# Patient Record
Sex: Female | Born: 1980
Health system: Southern US, Community
[De-identification: ages and names within clinical notes are randomized; demographics above are authoritative.]

## PROBLEM LIST (undated history)

## (undated) ENCOUNTER — Inpatient Hospital Stay (HOSPITAL_COMMUNITY): Payer: Self-pay

## (undated) DIAGNOSIS — E119 Type 2 diabetes mellitus without complications: Secondary | ICD-10-CM

## (undated) DIAGNOSIS — D649 Anemia, unspecified: Secondary | ICD-10-CM

## (undated) DIAGNOSIS — O24419 Gestational diabetes mellitus in pregnancy, unspecified control: Secondary | ICD-10-CM

## (undated) DIAGNOSIS — F32A Depression, unspecified: Secondary | ICD-10-CM

## (undated) DIAGNOSIS — F329 Major depressive disorder, single episode, unspecified: Secondary | ICD-10-CM

## (undated) HISTORY — DX: Anemia, unspecified: D64.9

## (undated) HISTORY — DX: Type 2 diabetes mellitus without complications: E11.9

## (undated) HISTORY — DX: Gestational diabetes mellitus in pregnancy, unspecified control: O24.419

## (undated) HISTORY — DX: Major depressive disorder, single episode, unspecified: F32.9

## (undated) HISTORY — DX: Depression, unspecified: F32.A

---

## 2005-05-07 HISTORY — PX: OTHER SURGICAL HISTORY: SHX169

## 2016-11-06 LAB — OB RESULTS CONSOLE ANTIBODY SCREEN: Antibody Screen: NEGATIVE

## 2016-11-06 LAB — OB RESULTS CONSOLE ABO/RH
RH TYPE: NEGATIVE
RH TYPE: POSITIVE

## 2016-11-06 LAB — OB RESULTS CONSOLE HEPATITIS B SURFACE ANTIGEN: HEP B S AG: NEGATIVE

## 2016-11-06 LAB — OB RESULTS CONSOLE RPR: RPR: NONREACTIVE

## 2016-11-06 LAB — OB RESULTS CONSOLE VARICELLA ZOSTER ANTIBODY, IGG
VARICELLA IGG: IMMUNE
Varicella: IMMUNE

## 2016-11-06 LAB — OB RESULTS CONSOLE HIV ANTIBODY (ROUTINE TESTING): HIV: NONREACTIVE

## 2016-11-06 LAB — SICKLE CELL SCREEN: SICKLE CELL SCREEN: NORMAL

## 2016-11-06 LAB — OB RESULTS CONSOLE RUBELLA ANTIBODY, IGM
RUBELLA: IMMUNE
Rubella: IMMUNE

## 2016-11-26 DIAGNOSIS — F329 Major depressive disorder, single episode, unspecified: Secondary | ICD-10-CM | POA: Insufficient documentation

## 2016-11-26 DIAGNOSIS — O24319 Unspecified pre-existing diabetes mellitus in pregnancy, unspecified trimester: Secondary | ICD-10-CM | POA: Insufficient documentation

## 2016-11-26 DIAGNOSIS — O99342 Other mental disorders complicating pregnancy, second trimester: Principal | ICD-10-CM

## 2016-11-26 DIAGNOSIS — F32A Depression, unspecified: Secondary | ICD-10-CM | POA: Insufficient documentation

## 2016-12-13 LAB — OB RESULTS CONSOLE GC/CHLAMYDIA
CHLAMYDIA, DNA PROBE: NEGATIVE
Chlamydia: NEGATIVE
GC PROBE AMP, GENITAL: NEGATIVE
Gonorrhea: NEGATIVE

## 2016-12-13 LAB — OB RESULTS CONSOLE PLATELET COUNT
Platelets: 271
Platelets: 271

## 2016-12-13 LAB — OB RESULTS CONSOLE HGB/HCT, BLOOD
HCT: 33
HCT: 33
Hemoglobin: 10.8
Hemoglobin: 10.8

## 2016-12-20 ENCOUNTER — Other Ambulatory Visit: Payer: Self-pay

## 2016-12-26 ENCOUNTER — Encounter: Payer: Self-pay | Admitting: *Deleted

## 2016-12-26 LAB — AFP, QUAD SCREEN: AFP, SERUM MAT SCREEN: POSITIVE

## 2016-12-31 ENCOUNTER — Encounter: Payer: Self-pay | Admitting: Lab

## 2017-01-03 ENCOUNTER — Ambulatory Visit: Payer: Self-pay | Admitting: *Deleted

## 2017-01-03 ENCOUNTER — Encounter: Payer: Self-pay | Attending: Family Medicine | Admitting: *Deleted

## 2017-01-03 DIAGNOSIS — R7309 Other abnormal glucose: Secondary | ICD-10-CM

## 2017-01-03 NOTE — Progress Notes (Signed)
  Patient was seen on 01/03/2017 for Gestational Diabetes self-management. Patient is here with Spanish interpretor and with her grown daughter. She states history of GDM 5 years ago but states she didn't have to check her BG at that time. She expresses needle phobia today. Diet history obtained, food choices appear appropriate other than drinking an occasional regular soda with supper meal. The following learning objectives were met by the patient :   States the definition of Gestational Diabetes  States why dietary management is important in controlling blood glucose  Describes the effects of carbohydrates on blood glucose levels  Demonstrates ability to create a balanced meal plan  Demonstrates carbohydrate counting   States when to check blood glucose levels  Demonstrates proper blood glucose monitoring techniques  States the effect of stress and exercise on blood glucose levels  States the importance of limiting caffeine and abstaining from alcohol and smoking  Plan:  Aim for 3 Carb Choices per meal (45 grams) +/- 1 either way  Aim for 1-2 Carbs per snack Begin reading food labels for Total Carbohydrate of foods Consider  increasing your activity level by walking or other activity daily as tolerated Begin checking BG before breakfast and 2 hours after first bite of breakfast, lunch and dinner as directed by MD  Bring Log Book to every medical appointment   Take medication if directed by MD  Blood glucose monitor given: [employer] Track Lot # [id number] Exp: 08/25/2018 Blood glucose reading: 93 mg/dl  Patient instructed to monitor glucose levels: FBS: 60 - 95 mg/dl 2 hour: <120 mg/dl  Patient received the following handouts:in Spanish  Nutrition Diabetes and Pregnancy  Carbohydrate Counting List  Patient will be seen for follow-up as needed.

## 2017-01-08 ENCOUNTER — Telehealth: Payer: Self-pay | Admitting: General Practice

## 2017-01-08 ENCOUNTER — Telehealth: Payer: Self-pay | Admitting: *Deleted

## 2017-01-08 NOTE — Telephone Encounter (Signed)
Patient called stating that she checked her sugar this morning and it was 100. At lunch she had a piece of no sugar bread and 2 sips of coffee followed by a bottle of water. After 2 hrs she checked her sugar ad it was 141. She used a different meter and it was 160. She checked again and it was 178. Patient states that she felt dizzy, had a headache and felt as though her head was very heavy. The patient had blurred vision as well. She states her pressure was normal. I have made an attempt to reach her OB/GYN office.

## 2017-01-08 NOTE — Telephone Encounter (Signed)
Called patient with pacific interpreter [phone] to check on how patient was feeling. Patient reports feeling slightly better today compared to yesterday but not great. Patient reports feeling itching and heaviness in her eyes, vomiting at times, dizziness, neck pain, headache making it difficult to hear at times. [facility]. Patient states her fasting blood sugar today was 100. She had a slice of white bread and a couple sips of coffee and a bottle of water. Patient states 2 hr pp it was 141. She checked it again with a new strip immediately after and it was 160 and again and it was 178. Patient states around 2pm today she had a slice of pizza and has not checked her blood sugar since. Asked patient it check blood sugar now. Patient did and it was 128. Told patient it doesn't sound like she is eating very much. Patient states she hasn't been eating a whole lot since her diabetes education appointment. Patient states she ate beef with tomato sauce one night and her blood sugar was really high and she got scared and hasn't really been eating. Discussed with patient that she needs to be eating breakfast, lunch, and dinner with some snacks as recommended for diabetic diet. Told patient if she is eating appropriate foods and her blood sugar is high that may mean she needs medication to help control her blood sugars. Told patient we still want her eating well otherwise she may feel bad like she is currently feeling. Told patient to eat a recommended dinner tonight & breakfast tomorrow and to bring her blood sugars with her tomorrow & hopefully she will feel better after eating more. Patient verbalized understanding to all & had no questions

## 2017-01-09 ENCOUNTER — Encounter: Payer: Self-pay | Admitting: Obstetrics & Gynecology

## 2017-01-09 ENCOUNTER — Telehealth: Payer: Self-pay | Admitting: *Deleted

## 2017-01-09 ENCOUNTER — Ambulatory Visit (INDEPENDENT_AMBULATORY_CARE_PROVIDER_SITE_OTHER): Payer: Self-pay | Admitting: Obstetrics & Gynecology

## 2017-01-09 ENCOUNTER — Other Ambulatory Visit: Payer: Self-pay | Admitting: Obstetrics & Gynecology

## 2017-01-09 VITALS — BP 93/55 | HR 78 | Ht 62.0 in | Wt 222.1 lb

## 2017-01-09 DIAGNOSIS — O99342 Other mental disorders complicating pregnancy, second trimester: Secondary | ICD-10-CM

## 2017-01-09 DIAGNOSIS — O24415 Gestational diabetes mellitus in pregnancy, controlled by oral hypoglycemic drugs: Secondary | ICD-10-CM

## 2017-01-09 DIAGNOSIS — O0992 Supervision of high risk pregnancy, unspecified, second trimester: Secondary | ICD-10-CM

## 2017-01-09 DIAGNOSIS — O0990 Supervision of high risk pregnancy, unspecified, unspecified trimester: Secondary | ICD-10-CM | POA: Insufficient documentation

## 2017-01-09 DIAGNOSIS — F329 Major depressive disorder, single episode, unspecified: Secondary | ICD-10-CM

## 2017-01-09 LAB — POCT URINALYSIS DIP (DEVICE)
BILIRUBIN URINE: NEGATIVE
GLUCOSE, UA: NEGATIVE mg/dL
HGB URINE DIPSTICK: NEGATIVE
Ketones, ur: 15 mg/dL — AB
LEUKOCYTES UA: NEGATIVE
NITRITE: NEGATIVE
Protein, ur: NEGATIVE mg/dL
Specific Gravity, Urine: 1.025 (ref 1.005–1.030)
Urobilinogen, UA: 0.2 mg/dL (ref 0.0–1.0)
pH: 6.5 (ref 5.0–8.0)

## 2017-01-09 LAB — GLUCOSE, 1 HOUR: Glucose 1 Hour: 147

## 2017-01-09 LAB — GLUCOSE, 3 HOUR

## 2017-01-09 MED ORDER — ASPIRIN EC 81 MG PO TBEC: 81.0000 mg | DELAYED_RELEASE_TABLET | Freq: Every day | ORAL | 1 refills | Status: DC

## 2017-01-09 MED ORDER — PRENATAL VITAMINS 0.8 MG PO TABS: 1.0000 | ORAL_TABLET | Freq: Every day | ORAL | 12 refills | Status: DC

## 2017-01-09 MED ORDER — GLYBURIDE 2.5 MG PO TABS: 2.5000 mg | ORAL_TABLET | Freq: Every day | ORAL | 3 refills | Status: DC

## 2017-01-09 NOTE — Progress Notes (Signed)
   PRENATAL VISIT NOTE  Subjective:  Tiffany Lamb is a [age] y.o. G7P6006 at 332w2d being seen today for ongoing prenatal care. Previous pregnancies all NSVD. She is currently monitored for the following issues for this high-risk pregnancy and has Depression affecting pregnancy in second trimester, antepartum; Gestational diabetes mellitus; and Supervision of high risk pregnancy, antepartum on her problem list. 3hr GTT 99, 183, 124, 61. She reports that she will breastfeed if it is a girl, but is not planning on it if the child is a boy.   Patient reports no complaints.  Contractions: Not present. Vag. Bleeding: None.  Movement: Present. Denies leaking of fluid.   The following portions of the patient's history were reviewed and updated as appropriate: allergies, current medications, past family history, past medical history, past social history, past surgical history and problem list. Problem list updated.  Objective:   Vitals:   01/09/17 1352 01/09/17 1412  BP: (!) 93/55   Pulse: 78   Weight: 222 lb 1.6 oz (100.7 kg)   Height:  5\' 2"  (1.575 m)    Fetal Status: Fetal Heart Rate (bpm): 147 Fundal Height: 22 cm Movement: Present    General:  Alert, oriented and cooperative. Patient is in no acute distress.  Skin: Skin is warm and dry. No rash noted.   Cardiovascular: Normal heart rate noted  Respiratory: Normal respiratory effort, no problems with respiration noted  Abdomen: Soft, gravid, appropriate for gestational age. Pain/Pressure: Present     Pelvic:  Cervical exam deferred        Extremities: Normal range of motion.  Edema: None  Mental Status: Normal mood and affect. Normal behavior. Normal judgment and thought content.   Assessment and Plan:  Pregnancy: G7P6006 at 282w2d  1. Supervision of high risk pregnancy, antepartum - Initial prenatal labs at [facility] unremarkable - TSH, BMP, Urine Pro/Cr  2. Diet controlled gestational diabetes mellitus (GDM) in second trimester -  Glyburide 2.5mg  daily at bedtime - ASA 81mg  daily - Anatomy US - Fetal echo - Eye exam for retinal evaluation - Continue CBG monitoring   3. Depression affecting pregnancy in second trimester - Not currently on medication - F/u with SW prn  There are no diagnoses linked to this encounter. Preterm labor symptoms and general obstetric precautions including but not limited to vaginal bleeding, contractions, leaking of fluid and fetal movement were reviewed in detail with the patient. Please refer to After Visit Summary for other counseling recommendations.  No Follow-up on file.  Dannette Barbararew Beth Goodlin, Medical Student

## 2017-01-09 NOTE — Telephone Encounter (Signed)
Patient has been scheduled for u/s on 9/18 @ 0830. Spanish interpreter [phone] used to call patient. Patient has been informed of her appointment and had no further questions.

## 2017-01-09 NOTE — Patient Instructions (Signed)
Diagnstico de diabetes mellitus gestacional  (Gestational Diabetes Mellitus, Diagnosis)  La diabetes gestacional (diabetes mellitus gestacional) es una forma de diabetes a corto plazo (temporal) que puede aparecer durante el embarazo. Este cuadro desaparece despus del parto. Puede deberse a uno de estos problemas o a ambos:   El cuerpo no produce la cantidad suficiente de una hormona llamada insulina.   El cuerpo no responde de forma normal a la insulina que produce.  La insulina permite que los ciertos azcares (glucosa) ingresen a las clulas del cuerpo. Esto le proporciona la energa. Cuando se tiene diabetes, la glucosa no puede ingresar a las clulas. Esto produce un aumento del nivel de glucosa en la sangre (hiperglucemia).  Si la diabetes se trata, es posible que ni usted ni el beb se vean afectados. El mdico fijar los objetivos del tratamiento para usted. Generalmente, los resultados de la glucemia deben ser los siguientes:   Despus de no comer durante mucho tiempo (ayunar): 95mg/dl (5,3mmol/l).   Despus de las comidas (posprandial):  ? Una hora despus de una comida: igual o menor que 140mg/dl (7,8mmol/l).  ? Dos horas despus de una comida: igual o menor que 120mg/dl (6,7mmol/l).   Nivel de A1c (hemoglobinaA1c): del 6% al 6,5%.  CUIDADOS EN EL HOGAR  Preguntas para hacerle al mdico  Puede hacer las siguientes preguntas:   Debo reunirme con un instructor para el cuidado de la diabetes?   Dnde puedo encontrar un grupo de apoyo para personas diabticas?   Qu equipos necesitar para cuidarme en casa?   Qu medicamentos para la diabetes necesito? Cundo debo tomarlos?   Con qu frecuencia debo controlarme el nivel de glucosa en la sangre?   A qu nmero puedo llamar si tengo preguntas?   Cundo es la prxima cita con el mdico?  Instrucciones generales   Tome los medicamentos de venta libre y los recetados solamente como se lo haya indicado el mdico.   Mantenga un peso  saludable durante el embarazo.   Concurra a todas las visitas de control como se lo haya indicado el mdico. Esto es importante.  SOLICITE AYUDA SI:   Su nivel de glucosa en la sangre es igual o superior a 240mg/dl (13,3mmol/dl).   Su nivel de glucosa en la sangre es igual o superior a 200mg/dl (11,1mmol/l), y tiene cetonas en la orina.   Ha estado enferma o ha tenido fiebre durante 2o ms das y no mejora.   Si tiene alguno de estos problemas durante ms de 6horas:  ? No puede comer ni beber.  ? Siente malestar estomacal (nuseas).  ? Vomita.  ? La materia fecal es lquida (diarrea).  SOLICITE AYUDA DE INMEDIATO SI:   El nivel de glucosa en la sangre est por debajo de 54mg/dl (3mmol/l).   Est confundida.   Tiene dificultad para hacer lo siguiente:  ? Pensar con claridad.  ? Respirar.   El beb se mueve menos de lo normal.   Tiene los siguientes sntomas:  ? Niveles moderados o altos de cetonas en la orina.  ? Hemorragia vaginal.  ? Secrecin de un lquido fuera de lo comn de la vagina.  ? Contracciones prematuras. Estas pueden causar una sensacin de opresin en el vientre.  Esta informacin no tiene como fin reemplazar el consejo del mdico. Asegrese de hacerle al mdico cualquier pregunta que tenga.  Document Released: 08/15/2015 Document Revised: 08/15/2015 Document Reviewed: 05/27/2015  Elsevier Interactive Patient Education  2018 [employer] Inc[employer]

## 2017-01-10 LAB — COMPREHENSIVE METABOLIC PANEL
A/G RATIO: 1.2 (ref 1.2–2.2)
ALBUMIN: 3.7 g/dL (ref 3.5–5.5)
ALT: 9 IU/L (ref 0–32)
AST: 15 IU/L (ref 0–40)
Alkaline Phosphatase: 78 IU/L (ref 39–117)
BUN / CREAT RATIO: 13 (ref 9–23)
BUN: 6 mg/dL (ref 6–20)
CHLORIDE: 104 mmol/L (ref 96–106)
CO2: 20 mmol/L (ref 20–29)
Calcium: 9 mg/dL (ref 8.7–10.2)
Creatinine, Ser: 0.45 mg/dL — ABNORMAL LOW (ref 0.57–1.00)
GFR calc Af Amer: 148 mL/min/{1.73_m2} (ref >59)
GFR calc non Af Amer: 128 mL/min/{1.73_m2} (ref >59)
GLOBULIN, TOTAL: 3.1 g/dL (ref 1.5–4.5)
GLUCOSE: 74 mg/dL (ref 65–99)
POTASSIUM: 4.5 mmol/L (ref 3.5–5.2)
SODIUM: 138 mmol/L (ref 134–144)
Total Protein: 6.8 g/dL (ref 6.0–8.5)

## 2017-01-10 LAB — TSH: TSH: 2.52 u[IU]/mL (ref 0.450–4.500)

## 2017-01-10 LAB — PROTEIN / CREATININE RATIO, URINE
Creatinine, Urine: 174.6 mg/dL
Protein, Ur: 11.9 mg/dL
Protein/Creat Ratio: 68 mg/g creat (ref 0–200)

## 2017-01-22 ENCOUNTER — Ambulatory Visit (HOSPITAL_COMMUNITY)
Admission: RE | Admit: 2017-01-22 | Discharge: 2017-01-22 | Disposition: A | Discharge status: Home / Self Care | Payer: Self-pay | Source: Ambulatory Visit | Attending: Obstetrics & Gynecology | Admitting: Obstetrics & Gynecology

## 2017-01-22 ENCOUNTER — Other Ambulatory Visit: Payer: Self-pay

## 2017-01-22 ENCOUNTER — Other Ambulatory Visit (HOSPITAL_COMMUNITY): Payer: Self-pay | Admitting: *Deleted

## 2017-01-22 ENCOUNTER — Other Ambulatory Visit: Payer: Self-pay | Admitting: Obstetrics & Gynecology

## 2017-01-22 ENCOUNTER — Encounter (HOSPITAL_COMMUNITY): Payer: Self-pay

## 2017-01-22 DIAGNOSIS — O0990 Supervision of high risk pregnancy, unspecified, unspecified trimester: Secondary | ICD-10-CM

## 2017-01-22 DIAGNOSIS — O99212 Obesity complicating pregnancy, second trimester: Secondary | ICD-10-CM

## 2017-01-22 DIAGNOSIS — Z3A23 23 weeks gestation of pregnancy: Secondary | ICD-10-CM

## 2017-01-22 DIAGNOSIS — O24312 Unspecified pre-existing diabetes mellitus in pregnancy, second trimester: Secondary | ICD-10-CM

## 2017-01-22 DIAGNOSIS — O09522 Supervision of elderly multigravida, second trimester: Secondary | ICD-10-CM

## 2017-01-22 DIAGNOSIS — O24415 Gestational diabetes mellitus in pregnancy, controlled by oral hypoglycemic drugs: Secondary | ICD-10-CM

## 2017-01-22 DIAGNOSIS — O0992 Supervision of high risk pregnancy, unspecified, second trimester: Secondary | ICD-10-CM | POA: Insufficient documentation

## 2017-01-22 DIAGNOSIS — E669 Obesity, unspecified: Secondary | ICD-10-CM | POA: Insufficient documentation

## 2017-01-22 DIAGNOSIS — Z6841 Body Mass Index (BMI) 40.0 and over, adult: Secondary | ICD-10-CM | POA: Insufficient documentation

## 2017-01-22 DIAGNOSIS — E119 Type 2 diabetes mellitus without complications: Secondary | ICD-10-CM | POA: Insufficient documentation

## 2017-01-22 DIAGNOSIS — Z3A21 21 weeks gestation of pregnancy: Secondary | ICD-10-CM | POA: Insufficient documentation

## 2017-01-22 DIAGNOSIS — O281 Abnormal biochemical finding on antenatal screening of mother: Secondary | ICD-10-CM | POA: Insufficient documentation

## 2017-01-28 ENCOUNTER — Ambulatory Visit (INDEPENDENT_AMBULATORY_CARE_PROVIDER_SITE_OTHER): Payer: Self-pay | Admitting: Family Medicine

## 2017-01-28 VITALS — BP 101/51 | HR 90 | Wt 228.1 lb

## 2017-01-28 DIAGNOSIS — O24415 Gestational diabetes mellitus in pregnancy, controlled by oral hypoglycemic drugs: Secondary | ICD-10-CM

## 2017-01-28 DIAGNOSIS — O0990 Supervision of high risk pregnancy, unspecified, unspecified trimester: Secondary | ICD-10-CM

## 2017-01-28 MED ORDER — GLYBURIDE 2.5 MG PO TABS: 2.5000 mg | ORAL_TABLET | Freq: Two times a day (BID) | ORAL | 3 refills | Status: DC

## 2017-01-28 MED ORDER — FAMOTIDINE 20 MG PO TABS: 20.0000 mg | ORAL_TABLET | Freq: Two times a day (BID) | ORAL | 3 refills | Status: DC

## 2017-01-28 NOTE — Progress Notes (Signed)
Subjective:  Tiffany Lamb is a 36 y.o. G7P6006 at [redacted]w[redacted]d being seen today for ongoing prenatal care.  She is currently monitored for the following issues for this high-risk pregnancy and has Depression affecting pregnancy in second trimester, antepartum; Gestational diabetes mellitus; and Supervision of high risk pregnancy, antepartum on her problem list.  GDM: Patient taking Glyburide 2.5mg  qhs.  Reports no hypoglycemic episodes.  Tolerating medication well Fasting: mostly controlled 2hr PP: >16 elevated from 120-160  Patient reports no complaints.  Contractions: Not present. Vag. Bleeding: None.  Movement: Present. Denies leaking of fluid.   The following portions of the patient's history were reviewed and updated as appropriate: allergies, current medications, past family history, past medical history, past social history, past surgical history and problem list. Problem list updated.  Objective:   Vitals:   01/28/17 1259  BP: (!) 101/51  Pulse: 90  Weight: 228 lb 1.6 oz (103.5 kg)    Fetal Status: Fetal Heart Rate (bpm): 136   Movement: Present     General:  Alert, oriented and cooperative. Patient is in no acute distress.  Skin: Skin is warm and dry. No rash noted.   Cardiovascular: Normal heart rate noted  Respiratory: Normal respiratory effort, no problems with respiration noted  Abdomen: Soft, gravid, appropriate for gestational age. Pain/Pressure: Present     Pelvic: Vag. Bleeding: None     Cervical exam deferred        Extremities: Normal range of motion.  Edema: Trace  Mental Status: Normal mood and affect. Normal behavior. Normal judgment and thought content.   Urinalysis:      Assessment and Plan:  Pregnancy: G7P6006 at [redacted]w[redacted]d  1. Supervision of high risk pregnancy, antepartum 28 week labs. Having possible RLS symptoms - will check for anemia.  2. Gestational diabetes mellitus (GDM) in second trimester controlled on oral hypoglycemic drug [place] q4 weeks Will  need antenatal testing at 32wks Increase Glyburide 2.5mg  BID   Preterm labor symptoms and general obstetric precautions including but not limited to vaginal bleeding, contractions, leaking of fluid and fetal movement were reviewed in detail with the patient. Please refer to After Visit Summary for other counseling recommendations.  No Follow-up on file.   Levie Heritage, DO

## 2017-01-28 NOTE — Progress Notes (Signed)
[employer] interpreter Stephannie Peters [phone]

## 2017-01-29 LAB — CBC
HEMATOCRIT: 31.7 % — AB (ref 34.0–46.6)
Hemoglobin: 10 g/dL — ABNORMAL LOW (ref 11.1–15.9)
MCH: 24.8 pg — ABNORMAL LOW (ref 26.6–33.0)
MCHC: 31.5 g/dL (ref 31.5–35.7)
MCV: 79 fL (ref 79–97)
PLATELETS: 262 10*3/uL (ref 150–379)
RBC: 4.03 x10E6/uL (ref 3.77–5.28)
RDW: 17.1 % — ABNORMAL HIGH (ref 12.3–15.4)
WBC: 7.8 10*3/uL (ref 3.4–10.8)

## 2017-01-29 LAB — RPR: RPR: NONREACTIVE

## 2017-01-29 LAB — HIV ANTIBODY (ROUTINE TESTING W REFLEX): HIV SCREEN 4TH GENERATION: NONREACTIVE

## 2017-02-01 ENCOUNTER — Telehealth: Payer: Self-pay | Admitting: General Practice

## 2017-02-01 NOTE — Telephone Encounter (Signed)
Called patient with pacific interpreter [phone], no answer- unable to leave message as voicemail was not set up. Will send letter

## 2017-02-01 NOTE — Telephone Encounter (Signed)
-----   Message from Levie Heritage, DO sent at 01/31/2017  8:28 AM EDT ----- Anemic. Please let patient know and start on iron twice a day.

## 2017-02-04 MED ORDER — IRON 325 (65 FE) MG PO TABS: 1.0000 | ORAL_TABLET | Freq: Two times a day (BID) | ORAL | 5 refills | Status: DC

## 2017-02-04 NOTE — Telephone Encounter (Signed)
Patient left message on nurse voicemail.  Returning our call.  Explained via [employer] interpreter - Nile Riggs -  that her iron is low and she needs to start taking iron twice daily.  Explained she could purchase over the counter.  Patient requests a prescription be phoned to her pharmacy [facility] on [address].  Told patient we will send there now.

## 2017-02-04 NOTE — Addendum Note (Signed)
Addended by: Darrel Hoover on: 02/04/2017 10:20 AM   Modules accepted: Orders

## 2017-02-06 ENCOUNTER — Encounter: Payer: Self-pay | Admitting: Family Medicine

## 2017-02-06 ENCOUNTER — Other Ambulatory Visit: Payer: Self-pay

## 2017-02-06 DIAGNOSIS — Z641 Problems related to multiparity: Secondary | ICD-10-CM | POA: Insufficient documentation

## 2017-02-06 DIAGNOSIS — O09529 Supervision of elderly multigravida, unspecified trimester: Secondary | ICD-10-CM | POA: Insufficient documentation

## 2017-02-06 NOTE — Progress Notes (Signed)
[place] on 9/18 showed IUP at [redacted]w[redacted]d mand MFM recommended changing EDC to 1/28 based on unsure LMP and limited US at 13 wks.  Chart updated as part of provider review

## 2017-02-11 ENCOUNTER — Ambulatory Visit (INDEPENDENT_AMBULATORY_CARE_PROVIDER_SITE_OTHER): Payer: Self-pay | Admitting: Obstetrics and Gynecology

## 2017-02-11 VITALS — BP 89/52 | HR 79 | Wt 229.1 lb

## 2017-02-11 DIAGNOSIS — O0990 Supervision of high risk pregnancy, unspecified, unspecified trimester: Secondary | ICD-10-CM

## 2017-02-11 DIAGNOSIS — O24319 Unspecified pre-existing diabetes mellitus in pregnancy, unspecified trimester: Secondary | ICD-10-CM

## 2017-02-11 DIAGNOSIS — O24312 Unspecified pre-existing diabetes mellitus in pregnancy, second trimester: Secondary | ICD-10-CM

## 2017-02-11 DIAGNOSIS — O0992 Supervision of high risk pregnancy, unspecified, second trimester: Secondary | ICD-10-CM

## 2017-02-11 DIAGNOSIS — O09522 Supervision of elderly multigravida, second trimester: Secondary | ICD-10-CM

## 2017-02-11 DIAGNOSIS — O09529 Supervision of elderly multigravida, unspecified trimester: Secondary | ICD-10-CM

## 2017-02-11 NOTE — Progress Notes (Signed)
Spanish Interpreter Mariel  

## 2017-02-11 NOTE — Progress Notes (Signed)
   PRENATAL VISIT NOTE - done via spanish interpretor  Subjective:  Tiffany Lamb is a 36 y.o. G7P6006 at [redacted]w[redacted]d being seen today for ongoing prenatal care.  She is currently monitored for the following issues for this high-risk pregnancy and has Depression affecting pregnancy in second trimester, antepartum; Maternal pregestational diabetes classes B through R, antepartum; Supervision of high risk pregnancy, antepartum; Advanced maternal age in multigravida; and Grand multiparity on her problem list.  Patient reports no complaints.  Contractions: Not present. Vag. Bleeding: None.  Movement: Present. Denies leaking of fluid.   Pregestational DM on glyburide 2.5 BID Fasting 70-80s PP 48-225 (mostly 80s-110s, 3 >140 which patient relates to eating big meals of rice)  The following portions of the patient's history were reviewed and updated as appropriate: allergies, current medications, past family history, past medical history, past social history, past surgical history and problem list. Problem list updated.  Objective:   Vitals:   02/11/17 1529  BP: (!) 89/52  Pulse: 79  Weight: 229 lb 1.6 oz (103.9 kg)    Fetal Status: Fetal Heart Rate (bpm): 143   Movement: Present     General:  Alert, oriented and cooperative. Patient is in no acute distress.  Skin: Skin is warm and dry. No rash noted.   Cardiovascular: Normal heart rate noted  Respiratory: Normal respiratory effort, no problems with respiration noted  Abdomen: Soft, gravid, appropriate for gestational age.  Pain/Pressure: Present     Pelvic: Cervical exam deferred        Extremities: Normal range of motion.  Edema: None  Mental Status:  Normal mood and affect. Normal behavior. Normal judgment and thought content.   Assessment and Plan:  Pregnancy: G7P6006 at [redacted]w[redacted]d  1. Antepartum multigravida of advanced maternal age  57. Maternal pregestational diabetes classes B through R, antepartum On glyburide 2.5 BID BS mostly  well controlled, reviewed importance of limiting high intake of carbs and will possibly increase next visit Schedule fetal echo today  3. Supervision of high risk pregnancy, antepartum [place] scheduled for 10/16  4. Depression Not working currently which is causing her great distress admits to some anxiety, FOB not involved, she does not want to see him again FOB of other children passed away Seeing psychiatrist regularly in [facility] which she believes is helping To see behavioral health here for social resources  Preterm labor symptoms and general obstetric precautions including but not limited to vaginal bleeding, contractions, leaking of fluid and fetal movement were reviewed in detail with the patient. Please refer to After Visit Summary for other counseling recommendations.  Return in about 2 weeks (around 02/25/2017) for OB visit (MD).   Conan Bowens, MD

## 2017-02-11 NOTE — Patient Instructions (Signed)
For colds and allergies  Any anti-histamine including benadryl, allegra, claritin, etc.  Sudafed but not phenylephrine  Mucinex  Robitussin  For Reflux/heartburn  Pepcid Zantac Tums Prilosec Prevacid Segundo trimestre de Psychiatrist (Second Trimester of Pregnancy) El segundo trimestre va desde la semana13 hasta la 28, desde el cuarto hasta el sexto mes, y suele ser el momento en el que mejor se siente. Su organismo se ha adaptado a Charity fundraiser y comienza a Diplomatic Services operational officer. En general, las nuseas matutinas han disminuido o han desaparecido completamente, puede tener ms energa y un aumento de apetito. El segundo trimestre es tambin la poca en la que el feto se desarrolla rpidamente. Hacia el final del sexto mes, el feto mide aproximadamente 9pulgadas (23cm) y pesa alrededor de 1 libras (700g). Es probable que sienta que el beb se Teacher, English as a foreign language (da pataditas) entre las 18 y 20semanas del Psychiatrist. CAMBIOS EN EL ORGANISMO Su organismo atraviesa por muchos cambios durante el [place], y estos varan de Neomia Dear mujer a Educational psychologist.  Seguir [employer] Companies. Notar que la parte baja del abdomen sobresale.  Podrn aparecer las primeras Albertson's caderas, el abdomen y las [place].  Es posible que tenga dolores de cabeza que pueden aliviarse con los medicamentos que el mdico le permita tomar.  Tal vez tenga necesidad de orinar con ms frecuencia porque el feto est ejerciendo presin Ambulance person.  Debido al Vanetta Mulders podr sentir Anthoney Harada estomacal con frecuencia.  Puede estar estreida, ya que ciertas hormonas enlentecen los movimientos de los msculos que [employer] desechos a travs de los intestinos.  Pueden aparecer hemorroides o abultarse e hincharse las venas (venas varicosas).  Puede tener dolor de espalda que se debe al Citigroup de peso y a que las hormonas del Management consultant las articulaciones entre los huesos de la pelvis, y Public librarian consecuencia de la modificacin del  peso y los msculos que mantienen el equilibrio.  Las ConAgra Foods seguirn creciendo y Development worker, community.  Las Veterinary surgeon y estar sensibles al cepillado y al hilo dental.  Pueden aparecer zonas oscuras o manchas (cloasma, mscara del Psychiatrist) en el rostro que probablemente se atenuar despus del nacimiento del beb.  Es posible que se forme una lnea oscura desde el ombligo hasta la zona del pubis (linea nigra) que probablemente se atenuar despus del nacimiento del beb.  Tal vez haya cambios en el cabello que pueden incluir su engrosamiento, crecimiento rpido y cambios en la textura. Adems, a algunas mujeres se les cae el cabello durante o despus del embarazo, o tienen el cabello seco o fino. Lo ms probable es que el cabello se le normalice despus del nacimiento del beb. QU DEBE ESPERAR EN LAS CONSULTAS PRENATALES Durante una visita prenatal de rutina:  La pesarn para asegurarse de que usted y el feto estn creciendo normalmente.  Le tomarn la presin arterial.  Le medirn el abdomen para controlar el desarrollo del beb.  Se escucharn los latidos cardacos fetales.  Se evaluarn los resultados de los estudios solicitados en visitas anteriores. El mdico puede preguntarle lo siguiente:  Cmo se siente.  Si siente los movimientos del beb.  Si ha tenido sntomas anormales, como prdida de lquido, [place], dolores de cabeza intensos o clicos abdominales.  Si est consumiendo algn producto que contenga tabaco, como cigarrillos, tabaco de Theatre manager y Administrator, Civil Service.  Si tiene [employer]gate-Palmolive. Otros estudios que podrn realizarse durante el segundo trimestre incluyen lo siguiente:  Anlisis de sangre para detectar lo siguiente: ? Concentraciones  de hierro bajas (anemia). ? Diabetes gestacional (entre la semana 24 y la 28). ? Anticuerpos Rh.  Anlisis de orina para detectar infecciones, diabetes o protenas en la orina.  Una ecografa para confirmar que el  beb crece y se desarrolla correctamente.  Una amniocentesis para diagnosticar posibles problemas genticos.  Estudios del feto para descartar espina bfida y sndrome de Down.  Prueba del VIH (virus de inmunodeficiencia humana). Los exmenes prenatales de rutina incluyen la prueba de deteccin del VIH, a menos que decida no Futures trader. INSTRUCCIONES PARA EL CUIDADO EN EL HOGAR  Evite fumar, consumir hierbas, beber alcohol y tomar frmacos que no le hayan recetado. Estas sustancias qumicas afectan la formacin y el desarrollo del beb.  No consuma ningn producto que contenga tabaco, lo que incluye cigarrillos, tabaco de Theatre manager y Administrator, Civil Service. Si necesita ayuda para dejar de fumar, consulte al American Express. Puede recibir asesoramiento y otro tipo de recursos para dejar de fumar.  Siga las indicaciones del mdico en relacin con el uso de medicamentos. Durante el embarazo, hay medicamentos que son seguros de tomar y otros que no.  Haga ejercicio solamente como se lo haya indicado el mdico. Sentir clicos uterinos es un buen signo para Restaurant manager, fast food actividad fsica.  Contine comiendo alimentos sanos con regularidad.  Use un sostn que le brinde buen soporte si le Altria Group.  No se d baos de inmersin en agua caliente, baos turcos ni saunas.  Use el cinturn de seguridad en todo momento mientras conduce.  No coma carne cruda ni queso sin cocinar; evite el contacto con las bandejas sanitarias de los gatos y la tierra que estos animales usan. Estos elementos contienen grmenes que pueden causar defectos congnitos en el beb.  Tome las vitaminas prenatales.  Tome entre 1500 y  de calcio diariamente comenzando en la semana20 del embarazo [place].  Si est estreida, pruebe un laxante suave (si el mdico lo autoriza). Consuma ms alimentos ricos en fibra, como vegetales y frutas frescos y Radiation protection practitioner. Beba gran cantidad de lquido para mantener la  orina de tono claro o color amarillo plido.  Dese baos de asiento con agua tibia para Engineer, materials o las molestias causadas por las hemorroides. Use una crema para las hemorroides si el mdico la autoriza.  Si tiene venas varicosas, use medias de descanso. Eleve los pies durante , 3 o 4veces por da. Limite el consumo de sal en su dieta.  No levante objetos pesados, use zapatos de tacones bajos y [address].  Descanse con las piernas elevadas si tiene calambres o dolor de cintura.  Visite a su dentista si an no lo ha Occupational hygienist. Use un cepillo de dientes blando para higienizarse los dientes y psese el hilo dental con suavidad.  Puede seguir [employer], a menos que el mdico le indique lo contrario.  Concurra a todas las visitas prenatales segn las indicaciones de su mdico.  SOLICITE ATENCIN MDICA SI:  [place].  Siente clicos leves, presin en la pelvis o dolor persistente en el abdomen.  Tiene nuseas, vmitos o diarrea persistentes.  Brett Fairy secrecin vaginal con mal olor.  Siente dolor al ConocoPhillips.  SOLICITE ATENCIN MDICA DE INMEDIATO SI:  Tiene fiebre.  Tiene una prdida de lquido por la vagina.  Tiene sangrado o pequeas prdidas vaginales.  Siente dolor intenso o clicos en el abdomen.  Sube o baja de peso rpidamente.  Tiene dificultad para respirar y Electronics engineer  de pecho.  Sbitamente se le hinchan mucho el rostro, las [place], los tobillos, los pies o las piernas.  No ha sentido los movimientos del beb durante Georgianne Fick.  Siente un dolor de cabeza intenso que no se alivia con medicamentos.  Su visin se modifica.  Esta informacin no tiene Theme park manager el consejo del mdico. Asegrese de hacerle al mdico cualquier pregunta que tenga. Document Released: 01/31/2005 Document Revised: 05/14/2014 Document Reviewed: 06/24/2012 Elsevier Interactive Patient Education  2017  [employer]. For yeast infections  Monistat  For constipation  Colace  For minor aches and pains  Tylenol-do not take more than  in 24 hours. Therma-care or like heat packs

## 2017-02-13 ENCOUNTER — Encounter: Payer: Self-pay | Admitting: Obstetrics & Gynecology

## 2017-02-19 ENCOUNTER — Ambulatory Visit (HOSPITAL_COMMUNITY)
Admission: RE | Admit: 2017-02-19 | Discharge: 2017-02-19 | Disposition: A | Discharge status: Home / Self Care | Payer: Self-pay | Source: Ambulatory Visit | Attending: Obstetrics & Gynecology | Admitting: Obstetrics & Gynecology

## 2017-02-19 ENCOUNTER — Encounter (HOSPITAL_COMMUNITY): Payer: Self-pay

## 2017-02-19 ENCOUNTER — Other Ambulatory Visit (HOSPITAL_COMMUNITY): Payer: Self-pay | Admitting: *Deleted

## 2017-02-19 DIAGNOSIS — O09529 Supervision of elderly multigravida, unspecified trimester: Secondary | ICD-10-CM

## 2017-02-19 DIAGNOSIS — Z3A25 25 weeks gestation of pregnancy: Secondary | ICD-10-CM | POA: Insufficient documentation

## 2017-02-19 DIAGNOSIS — O24415 Gestational diabetes mellitus in pregnancy, controlled by oral hypoglycemic drugs: Secondary | ICD-10-CM

## 2017-02-19 DIAGNOSIS — Z641 Problems related to multiparity: Secondary | ICD-10-CM

## 2017-02-25 ENCOUNTER — Telehealth: Payer: Self-pay

## 2017-02-25 ENCOUNTER — Encounter: Payer: Self-pay | Admitting: Obstetrics & Gynecology

## 2017-02-25 ENCOUNTER — Other Ambulatory Visit: Payer: Self-pay

## 2017-02-25 NOTE — Progress Notes (Signed)
Error

## 2017-02-25 NOTE — Telephone Encounter (Signed)
Patient's daughter called the office requesting refills on her  Prenatal vitamins, glyburide and iron.Advised patient to call her pharmacy because she has refills at her selected pharmacy. Patient's daughter verbalized understanding and had no questions.

## 2017-03-07 ENCOUNTER — Encounter: Payer: Self-pay | Admitting: Obstetrics and Gynecology

## 2017-03-08 ENCOUNTER — Encounter: Payer: Self-pay | Admitting: Obstetrics and Gynecology

## 2017-03-11 ENCOUNTER — Encounter: Payer: Self-pay | Admitting: Obstetrics and Gynecology

## 2017-03-11 ENCOUNTER — Ambulatory Visit: Payer: Self-pay | Admitting: Clinical

## 2017-03-11 ENCOUNTER — Ambulatory Visit (INDEPENDENT_AMBULATORY_CARE_PROVIDER_SITE_OTHER): Payer: Self-pay | Admitting: Obstetrics and Gynecology

## 2017-03-11 VITALS — BP 102/72 | HR 103 | Wt 232.6 lb

## 2017-03-11 DIAGNOSIS — Z23 Encounter for immunization: Secondary | ICD-10-CM

## 2017-03-11 DIAGNOSIS — O0990 Supervision of high risk pregnancy, unspecified, unspecified trimester: Secondary | ICD-10-CM

## 2017-03-11 DIAGNOSIS — O99342 Other mental disorders complicating pregnancy, second trimester: Secondary | ICD-10-CM

## 2017-03-11 DIAGNOSIS — Z5989 Other problems related to housing and economic circumstances: Secondary | ICD-10-CM

## 2017-03-11 DIAGNOSIS — O09529 Supervision of elderly multigravida, unspecified trimester: Secondary | ICD-10-CM

## 2017-03-11 DIAGNOSIS — Z598 Other problems related to housing and economic circumstances: Secondary | ICD-10-CM

## 2017-03-11 DIAGNOSIS — O24319 Unspecified pre-existing diabetes mellitus in pregnancy, unspecified trimester: Secondary | ICD-10-CM

## 2017-03-11 DIAGNOSIS — Z658 Other specified problems related to psychosocial circumstances: Secondary | ICD-10-CM

## 2017-03-11 DIAGNOSIS — F329 Major depressive disorder, single episode, unspecified: Secondary | ICD-10-CM

## 2017-03-11 NOTE — BH Specialist Note (Signed)
Integrated Behavioral Health Initial Visit  MRN: [id number] Name: Tiffany Lamb  Number of Integrated Behavioral Health Clinician visits:: 1/6 Session Start time: 11:55  Session End time: 12:10 Total time: 15 minutes  Type of Service: Integrated Behavioral Health- Individual/Family Interpretor:Yes.   Interpretor Name and Language: Spanish   Warm Hand Off Completed.       SUBJECTIVE: Tiffany Lamb is a 36 y.o. female accompanied by n/a Patient was referred by Dr Earlene Plateravis for connect to local psychiatry for uninsured Patient reports the following symptoms/concerns: Pt states that she is currently seeing non-local psychiatrist for self-reported schizophrenia and depression; wants to know her options for psychiat[place] in [place] for [place]. Pt says she was denied Medicaid and the Adopt-a-mom program. Pt also worries after daughter's suicide attempt.  Duration of problem: Today; Severity of problem: moderate  OBJECTIVE: Mood: Appropriate and Affect: Appropriate Risk of harm to self or others: No plan to harm self or others  LIFE CONTEXT: Family and Social: Lives with 16yo twins, 14yo, 11yo, 5yo in a friend's home temporarily; 12yo son lives in [place].  School/Work: - Self-Care: Going to psychiatry; taking BH meds Life Changes: Current pregnancy  GOALS ADDRESSED: Patient will: 1. Reduce symptoms of: stress 2. Increase knowledge and/or ability of: stress reduction  3. Demonstrate ability to: Increase healthy adjustment to current life circumstances  INTERVENTIONS: Interventions utilized: Supportive Counseling  Standardized Assessments completed: GAD-7 and PHQ 9  ASSESSMENT: Patient currently experiencing Psychosocial stressors affecting household and Uninsured adult.    Patient may benefit from referral to appropriate community mental health services for continued [facility] and community resources.  PLAN: 1. Follow up with behavioral health clinician  on : Next medical appointment, 2 weeks 2. Behavioral recommendations:  -Pick up Financial Assistance application in Spanish at front desk today; bring back to next visit -[place] [facility] within one [place]; bring own interpreter at first visit.  -Bring all medications to next medical appointment, for medical provider to review -Consider crisis number options for daughter in the future, as discussed in office visit 3. Referral(s): Integrated Art gallery managerBehavioral Health Services (In Clinic), Community Mental Health Services (LME/Outside Clinic) and Community Resources:  Crisis numbers 4. "From scale of 1-10, how likely are you to follow plan?": 7  Leisha Trinkle C [place], LCSWA

## 2017-03-11 NOTE — Progress Notes (Signed)
PRENATAL VISIT NOTE - interview done via live interpretor in Spanish  Subjective:  Tiffany Lamb is a 36 y.o. G7P6006 at 1067w0d being seen today for ongoing prenatal care.  She is currently monitored for the following issues for this high-risk pregnancy and has Depression affecting pregnancy in second trimester, antepartum; Maternal pregestational diabetes classes B through R, antepartum; Supervision of high risk pregnancy, antepartum; Advanced maternal age in multigravida; and Grand multiparity on their problem list.  Patient reports no complaints regarding the pregnancy but concerned about her daughter..  Contractions: Not present. Vag. Bleeding: None.  Movement: Present. Denies leaking of fluid.   gDM - glyburide 2.5 mg BID She has not been checking her sugars the last few weeks because she lost all the supplies when her daughter went into the hospital  Depression: started taking med prescribed by psychiatrist but then stopped it after her daughter took all her meds in a suicide attempt. States she got refill and is now on her meds again, she is taking haloperidol and zoloft. Believes she may have been diagnosed with schizophrenia. She states she takes haloperidol when she is hearing things and when she needs it to help her sleep. States she is taking her zoloft. She will bring her meds and instructions to next visit.   She is understandably upset about her daughter's suicide attempt, feels she is the only person who can take care of the family so she has to be okay and take care of everyone. Sometimes feels sad and keeps herself busy. Denies thoughts of harm to herself or anybody else.   The following portions of the patient's history were reviewed and updated as appropriate: allergies, current medications, past family history, past medical history, past social history, past surgical history and problem list. Problem list updated.  Objective:   Vitals:   03/11/17 0958  BP: 102/72    Pulse: (!) 103  Weight: 232 lb 9.6 oz (105.5 kg)    Fetal Status: Fetal Heart Rate (bpm): 147   Movement: Present     General:  Alert, oriented and cooperative. Patient is in no acute distress.  Skin: Skin is warm and dry. No rash noted.   Cardiovascular: Normal heart rate noted  Respiratory: Normal respiratory effort, no problems with respiration noted  Abdomen: Soft, gravid, appropriate for gestational age.  Pain/Pressure: Present     Pelvic: Cervical exam deferred        Extremities: Normal range of motion.  Edema: None  Mental Status:  Normal mood and affect. Normal behavior. Normal judgment and thought content.   Assessment and Plan:  Pregnancy: G7P6006 at 2667w0d  1. Maternal pregestational diabetes classes B through R, antepartum Glyburide 2.5 BID Has not been checking sugars Gave her new supplies today Fetal echo scheduled today  10/16 growth 71st%ile  2. Depression affecting pregnancy in second trimester, antepartum [place] [facility] Started ?zoloft and haloperidol (patient unsure) Reports she is taking ?zoloft daily and ?haloperidol prn To see Asher MuirJamie with behavioral health today  3. Supervision of high risk pregnancy, antepartum tdap given today  4. Antepartum multigravida of advanced maternal age Recalculated quad screen low risk   Preterm labor symptoms and general obstetric precautions including but not limited to vaginal bleeding, contractions, leaking of fluid and fetal movement were reviewed in detail with the patient. Please refer to After Visit Summary for other counseling recommendations.  Return in about 2 weeks (around 03/25/2017) for OB visit (MD).   Conan BowensKelly M Primus Gritton, MD

## 2017-03-11 NOTE — Progress Notes (Signed)
[employer] interpreter Graciela  [id number]/Blanca

## 2017-03-11 NOTE — Patient Instructions (Addendum)
For colds and allergies  Any anti-histamine including benadryl, allegra, claritin, etc.  Sudafed but not phenylephrine  Mucinex  Robitussin  For Reflux/heartburn  Pepcid Zantac Tums Prilosec Prevacid  For yeast infections  Monistat  For constipation  Colace  For minor aches and pains  Tylenol-do not take more than 4000mg  in 24 hours. Therma-care or like heat packs    [facility]    [facility]       Phone: [phone]  [facility] [place] Phone: [phone]  [facility] [place]  Phone: [phone]  [facility] at [facility]  Phone: [phone]  [facility] [place]  Phone: [phone]  [place]  [facility]       Phone: [phone]  [facility]    Phone: [phone]   [facility]    Phone: [phone]  [facility]    Phone: [phone]  [facility]    Phone: [phone]  [facility]    Phone: [phone]  [facility]       Phone: [phone]   [facility]  Phone: [phone]  [facility]    Phone: [phone]  [facility] [place]   Phone: [phone]  [facility]      Phone: [phone]  [facility]    Phone: [phone]

## 2017-03-19 ENCOUNTER — Ambulatory Visit (HOSPITAL_COMMUNITY)
Admission: RE | Admit: 2017-03-19 | Discharge: 2017-03-19 | Disposition: A | Discharge status: Home / Self Care | Payer: Self-pay | Source: Ambulatory Visit | Attending: Obstetrics & Gynecology | Admitting: Obstetrics & Gynecology

## 2017-03-22 ENCOUNTER — Other Ambulatory Visit (HOSPITAL_COMMUNITY): Payer: Self-pay | Admitting: Obstetrics and Gynecology

## 2017-03-22 ENCOUNTER — Ambulatory Visit (HOSPITAL_COMMUNITY)
Admission: RE | Admit: 2017-03-22 | Discharge: 2017-03-22 | Disposition: A | Discharge status: Home / Self Care | Payer: Self-pay | Source: Ambulatory Visit | Attending: Obstetrics & Gynecology | Admitting: Obstetrics & Gynecology

## 2017-03-22 ENCOUNTER — Encounter (HOSPITAL_COMMUNITY): Payer: Self-pay

## 2017-03-22 DIAGNOSIS — Z641 Problems related to multiparity: Secondary | ICD-10-CM

## 2017-03-22 DIAGNOSIS — O289 Unspecified abnormal findings on antenatal screening of mother: Secondary | ICD-10-CM | POA: Insufficient documentation

## 2017-03-22 DIAGNOSIS — O09523 Supervision of elderly multigravida, third trimester: Secondary | ICD-10-CM | POA: Insufficient documentation

## 2017-03-22 DIAGNOSIS — Z3A29 29 weeks gestation of pregnancy: Secondary | ICD-10-CM

## 2017-03-22 DIAGNOSIS — O09299 Supervision of pregnancy with other poor reproductive or obstetric history, unspecified trimester: Secondary | ICD-10-CM | POA: Insufficient documentation

## 2017-03-22 DIAGNOSIS — Z362 Encounter for other antenatal screening follow-up: Secondary | ICD-10-CM

## 2017-03-22 DIAGNOSIS — O09529 Supervision of elderly multigravida, unspecified trimester: Secondary | ICD-10-CM

## 2017-03-22 DIAGNOSIS — O24415 Gestational diabetes mellitus in pregnancy, controlled by oral hypoglycemic drugs: Secondary | ICD-10-CM | POA: Insufficient documentation

## 2017-03-22 DIAGNOSIS — O99213 Obesity complicating pregnancy, third trimester: Secondary | ICD-10-CM | POA: Insufficient documentation

## 2017-03-25 ENCOUNTER — Ambulatory Visit (INDEPENDENT_AMBULATORY_CARE_PROVIDER_SITE_OTHER): Payer: Self-pay | Admitting: Obstetrics and Gynecology

## 2017-03-25 ENCOUNTER — Encounter: Payer: Self-pay | Admitting: Obstetrics and Gynecology

## 2017-03-25 VITALS — BP 100/69 | HR 123 | Wt 233.3 lb

## 2017-03-25 DIAGNOSIS — O0990 Supervision of high risk pregnancy, unspecified, unspecified trimester: Secondary | ICD-10-CM

## 2017-03-25 DIAGNOSIS — R110 Nausea: Secondary | ICD-10-CM

## 2017-03-25 DIAGNOSIS — O99342 Other mental disorders complicating pregnancy, second trimester: Secondary | ICD-10-CM

## 2017-03-25 DIAGNOSIS — Z641 Problems related to multiparity: Secondary | ICD-10-CM

## 2017-03-25 DIAGNOSIS — O09529 Supervision of elderly multigravida, unspecified trimester: Secondary | ICD-10-CM

## 2017-03-25 DIAGNOSIS — F329 Major depressive disorder, single episode, unspecified: Secondary | ICD-10-CM

## 2017-03-25 DIAGNOSIS — O24319 Unspecified pre-existing diabetes mellitus in pregnancy, unspecified trimester: Secondary | ICD-10-CM

## 2017-03-25 DIAGNOSIS — F32A Depression, unspecified: Secondary | ICD-10-CM

## 2017-03-25 LAB — POCT URINALYSIS DIP (DEVICE)
Bilirubin Urine: NEGATIVE
GLUCOSE, UA: 250 mg/dL — AB
Hgb urine dipstick: NEGATIVE
NITRITE: NEGATIVE
PROTEIN: NEGATIVE mg/dL
UROBILINOGEN UA: 0.2 mg/dL (ref 0.0–1.0)
pH: 6 (ref 5.0–8.0)

## 2017-03-25 MED ORDER — PROMETHAZINE HCL 25 MG PO TABS: 12.5000 mg | ORAL_TABLET | Freq: Four times a day (QID) | ORAL | 0 refills | Status: DC | PRN

## 2017-03-25 NOTE — Progress Notes (Signed)
   PRENATAL VISIT NOTE  Subjective:  Tiffany Lamb is a 36 y.o. G7P6006 at 36101w0d being seen today for ongoing prenatal care.  She is currently monitored for the following issues for this high-risk pregnancy and has Depression affecting pregnancy in second trimester, antepartum; Maternal pregestational diabetes classes B through R, antepartum; Supervision of high risk pregnancy, antepartum; Advanced maternal age in multigravida; and Grand multiparity on their problem list.  Patient reports no complaints.  Contractions: Not present. Vag. Bleeding: None.  Movement: Present. Denies leaking of fluid.   DM FG: 58 -83 (one 110) BG: 82-151 (outlier, mostly under 115)  The following portions of the patient's history were reviewed and updated as appropriate: allergies, current medications, past family history, past medical history, past social history, past surgical history and problem list. Problem list updated.  Objective:   Vitals:   03/25/17 1016  BP: 100/69  Pulse: (!) 123  Weight: 233 lb 4.8 oz (105.8 kg)    Fetal Status: Fetal Heart Rate (bpm): 150   Movement: Present     General:  Alert, oriented and cooperative. Patient is in no acute distress.  Skin: Skin is warm and dry. No rash noted.   Cardiovascular: Normal heart rate noted  Respiratory: Normal respiratory effort, no problems with respiration noted  Abdomen: Soft, gravid, appropriate for gestational age.  Pain/Pressure: Present     Pelvic: Cervical exam deferred        Extremities: Normal range of motion.  Edema: None  Mental Status:  Normal mood and affect. Normal behavior. Normal judgment and thought content.   Assessment and Plan:  Pregnancy: G7P6006 at 77101w0d  1. Maternal pregestational diabetes classes B through R, antepartum Glyburide 2.5 QHS Fairly well controlled, reports she is not eating dinner, reviewed importance of small meals to maintain blood sugar Fetal echo scheduled for 04/04/17 @ 11:30 with Dr.  Elizebeth Brookingotton  2. Supervision of high risk pregnancy, antepartum  3. Antepartum multigravida of advanced maternal age On baby ASA  4. Grand multiparity Declines contraception  5. Depression affecting pregnancy in second trimester, antepartum On zoloft 50mg , haldol 1mg  prn for anxiety  6. Tachycardia - reports feeling heart racing when they took pulse - repeat 104 - cont to monitor, consider EKG if worsens  Preterm labor symptoms and general obstetric precautions including but not limited to vaginal bleeding, contractions, leaking of fluid and fetal movement were reviewed in detail with the patient. Please refer to After Visit Summary for other counseling recommendations.  Return in about 2 weeks (around 04/08/2017) for OB visit.   Conan BowensKelly M Ronin Crager, MD

## 2017-03-25 NOTE — Progress Notes (Signed)
Declines flu shot

## 2017-03-25 NOTE — Patient Instructions (Signed)
Go to appointment with Dr. Elizebeth Brookingotton on 04/04/17. [facility] [address] Phone: [phone]    Tercer trimestre de Psychiatristembarazo (Third Trimester of Pregnancy) El tercer trimestre comprende desde la semana29 The ServiceMaster Companyhasta la semana42, es decir, desde el mes7 hasta el [address]. En este trimestre, el feto crece muy rpido. Hacia el final del noveno mes, el feto mide alrededor de 20pulgadas (45cm) de largo y pesa entre 6y 10libras [phone](2,700y 4,500kg). CUIDADOS EN EL HOGAR  No fume, no consuma hierbas ni beba alcohol. No tome frmacos que el mdico no haya autorizado.  No consuma ningn producto que contenga tabaco, lo que incluye cigarrillos, tabaco de Theatre managermascar o Administrator, Civil Servicecigarrillos electrnicos. Si necesita ayuda para dejar de fumar, consulte al American Expressmdico. Puede recibir asesoramiento u otro tipo de apoyo para dejar de fumar.  Tome los medicamentos solamente como se lo haya indicado el mdico. Algunos medicamentos son seguros para tomar durante el Psychiatristembarazo y otros no lo son.  Haga ejercicios solamente como se lo haya indicado el mdico. Interrumpa la actividad fsica si comienza a tener calambres.  Ingiera alimentos saludables de Rosedalemanera regular.  Use un sostn que le brinde buen soporte si sus mamas estn sensibles.  No se d baos de inmersin en agua caliente, baos turcos ni saunas.  Colquese el cinturn de seguridad cuando conduzca.  No coma carne cruda ni queso sin cocinar; evite el contacto con las bandejas sanitarias de los gatos y la tierra que estos animales usan.  Tome las vitaminas prenatales.  Tome entre 1500 y 2000mg  de calcio diariamente comenzando en la semana20 del embarazo [place] el parto.  Pruebe tomar un medicamento que la ayude a defecar (un laxante suave) si el mdico lo autoriza. Consuma ms fibra, que se encuentra en las frutas y verduras frescas y los cereales integrales. Beba suficiente lquido para mantener el pis (orina) claro o de color amarillo  plido.  Dese baos de asiento con agua tibia para Engineer, materialsaliviar el dolor o las molestias causadas por las hemorroides. Use una crema para las hemorroides si el mdico la autoriza.  Si se le hinchan las venas (venas varicosas), use medias de descanso. Levante (eleve) los pies durante 15minutos, 3 o 4veces por Futures traderda. Limite el consumo de sal en su dieta.  No levante objetos pesados, use zapatos de tacones bajos y sintese derecha.  Descanse con las piernas elevadas si tiene calambres o dolor de cintura.  Visite a su dentista si no lo ha Occupational hygienisthecho durante el embarazo. Use un cepillo de cerdas suaves para cepillarse los dientes. Psese el hilo dental con suavidad.  Puede seguir Calpine Corporationmanteniendo relaciones sexuales, a menos que el mdico le indique lo contrario.  No haga viajes de larga distancia, excepto si es obligatorio y solamente con la aprobacin del mdico.  Tome clases prenatales.  Practique ir manejando al hospital.  Prepare el bolso que llevar al hospital.  Prepare la habitacin del beb.  Concurra a los controles mdicos.  SOLICITE AYUDA SI:  No est segura de si est en trabajo de parto o si ha roto la bolsa de las aguas.  Tiene mareos.  Siente calambres leves o presin en la parte inferior del abdomen.  Sufre un dolor persistente en el abdomen.  Tiene Programme researcher, broadcasting/film/videomalestar estomacal (nuseas), vmitos, o tiene deposiciones acuosas (diarrea).  Advierte un olor ftido que proviene de la vagina.  Siente dolor al ConocoPhillipsorinar.  SOLICITE AYUDA DE INMEDIATO SI:  Tiene fiebre.  Tiene una prdida de lquido por la vagina.  Tiene  sangrado o pequeas prdidas vaginales.  Siente dolor intenso o clicos en el abdomen.  Sube o baja de peso rpidamente.  Tiene dificultades para recuperar el aliento y siente dolor en el pecho.  Sbitamente se le hinchan mucho el rostro, las Lislemanos, los tobillos, los pies o las piernas.  No ha sentido los movimientos del beb durante Georgianne Fickuna hora.  Siente un dolor de  cabeza intenso que no se alivia con medicamentos.  Su visin se modifica.  Esta informacin no tiene Theme park managercomo fin reemplazar el consejo del mdico. Asegrese de hacerle al mdico cualquier pregunta que tenga. Document Released: 12/24/2012 Document Revised: 05/14/2014 Document Reviewed: 06/24/2012 Elsevier Interactive Patient Education  2017 [employer]

## 2017-04-08 ENCOUNTER — Encounter: Payer: Self-pay | Admitting: Family Medicine

## 2017-04-09 ENCOUNTER — Encounter: Payer: Self-pay | Admitting: *Deleted

## 2017-04-11 ENCOUNTER — Encounter: Payer: Self-pay | Admitting: Obstetrics & Gynecology

## 2017-04-16 ENCOUNTER — Ambulatory Visit (HOSPITAL_COMMUNITY): Admission: RE | Admit: 2017-04-16 | Payer: Self-pay | Source: Ambulatory Visit

## 2017-04-18 ENCOUNTER — Ambulatory Visit (HOSPITAL_COMMUNITY): Payer: Self-pay

## 2017-04-19 ENCOUNTER — Ambulatory Visit (HOSPITAL_COMMUNITY)
Admission: RE | Admit: 2017-04-19 | Discharge: 2017-04-19 | Disposition: A | Discharge status: Home / Self Care | Payer: Self-pay | Source: Ambulatory Visit | Attending: Obstetrics & Gynecology | Admitting: Obstetrics & Gynecology

## 2017-04-19 ENCOUNTER — Encounter (HOSPITAL_COMMUNITY): Payer: Self-pay

## 2017-04-19 ENCOUNTER — Other Ambulatory Visit (HOSPITAL_COMMUNITY): Payer: Self-pay | Admitting: *Deleted

## 2017-04-19 ENCOUNTER — Other Ambulatory Visit (HOSPITAL_COMMUNITY): Payer: Self-pay | Admitting: Obstetrics and Gynecology

## 2017-04-19 DIAGNOSIS — O09299 Supervision of pregnancy with other poor reproductive or obstetric history, unspecified trimester: Secondary | ICD-10-CM

## 2017-04-19 DIAGNOSIS — O09523 Supervision of elderly multigravida, third trimester: Secondary | ICD-10-CM | POA: Insufficient documentation

## 2017-04-19 DIAGNOSIS — Z362 Encounter for other antenatal screening follow-up: Secondary | ICD-10-CM

## 2017-04-19 DIAGNOSIS — O24415 Gestational diabetes mellitus in pregnancy, controlled by oral hypoglycemic drugs: Secondary | ICD-10-CM

## 2017-04-19 DIAGNOSIS — O24313 Unspecified pre-existing diabetes mellitus in pregnancy, third trimester: Secondary | ICD-10-CM

## 2017-04-19 DIAGNOSIS — O24919 Unspecified diabetes mellitus in pregnancy, unspecified trimester: Secondary | ICD-10-CM

## 2017-04-19 DIAGNOSIS — O289 Unspecified abnormal findings on antenatal screening of mother: Secondary | ICD-10-CM

## 2017-04-19 DIAGNOSIS — Z3A33 33 weeks gestation of pregnancy: Secondary | ICD-10-CM

## 2017-04-19 DIAGNOSIS — O99213 Obesity complicating pregnancy, third trimester: Secondary | ICD-10-CM | POA: Insufficient documentation

## 2017-05-02 ENCOUNTER — Ambulatory Visit (INDEPENDENT_AMBULATORY_CARE_PROVIDER_SITE_OTHER): Payer: Self-pay | Admitting: Obstetrics and Gynecology

## 2017-05-02 ENCOUNTER — Telehealth: Payer: Self-pay | Admitting: *Deleted

## 2017-05-02 ENCOUNTER — Encounter: Payer: Self-pay | Admitting: Obstetrics and Gynecology

## 2017-05-02 ENCOUNTER — Encounter: Payer: Self-pay | Admitting: *Deleted

## 2017-05-02 VITALS — BP 115/60 | HR 100 | Wt 240.3 lb

## 2017-05-02 DIAGNOSIS — O0990 Supervision of high risk pregnancy, unspecified, unspecified trimester: Secondary | ICD-10-CM

## 2017-05-02 DIAGNOSIS — O99013 Anemia complicating pregnancy, third trimester: Secondary | ICD-10-CM

## 2017-05-02 DIAGNOSIS — R12 Heartburn: Secondary | ICD-10-CM

## 2017-05-02 DIAGNOSIS — O24319 Unspecified pre-existing diabetes mellitus in pregnancy, unspecified trimester: Secondary | ICD-10-CM

## 2017-05-02 DIAGNOSIS — Z113 Encounter for screening for infections with a predominantly sexual mode of transmission: Secondary | ICD-10-CM

## 2017-05-02 DIAGNOSIS — O09523 Supervision of elderly multigravida, third trimester: Secondary | ICD-10-CM

## 2017-05-02 DIAGNOSIS — O26893 Other specified pregnancy related conditions, third trimester: Secondary | ICD-10-CM

## 2017-05-02 DIAGNOSIS — F32A Depression, unspecified: Secondary | ICD-10-CM

## 2017-05-02 DIAGNOSIS — F329 Major depressive disorder, single episode, unspecified: Secondary | ICD-10-CM

## 2017-05-02 DIAGNOSIS — O99342 Other mental disorders complicating pregnancy, second trimester: Secondary | ICD-10-CM

## 2017-05-02 LAB — POCT URINALYSIS DIP (DEVICE)
Bilirubin Urine: NEGATIVE
Glucose, UA: NEGATIVE mg/dL
Hgb urine dipstick: NEGATIVE
Ketones, ur: NEGATIVE mg/dL
NITRITE: NEGATIVE
PH: 7 (ref 5.0–8.0)
PROTEIN: NEGATIVE mg/dL
Specific Gravity, Urine: 1.025 (ref 1.005–1.030)
Urobilinogen, UA: 0.2 mg/dL (ref 0.0–1.0)

## 2017-05-02 MED ORDER — PRENATAL VITAMINS 0.8 MG PO TABS: 1.0000 | ORAL_TABLET | Freq: Every day | ORAL | 12 refills | Status: DC

## 2017-05-02 MED ORDER — IRON 325 (65 FE) MG PO TABS: 1.0000 | ORAL_TABLET | Freq: Two times a day (BID) | ORAL | 5 refills | Status: DC

## 2017-05-02 MED ORDER — GLYBURIDE 2.5 MG PO TABS: 2.5000 mg | ORAL_TABLET | Freq: Two times a day (BID) | ORAL | 3 refills | Status: DC

## 2017-05-02 MED ORDER — FAMOTIDINE 20 MG PO TABS
20.0000 mg | ORAL_TABLET | Freq: Two times a day (BID) | ORAL | 3 refills | Status: DC
Start: 2017-05-02 — End: 2018-10-17

## 2017-05-02 NOTE — Patient Instructions (Signed)
Parto vaginal Vaginal Delivery Parto vaginal significa que usted dar a luz empujando al beb fuera del canal del parto (vagina). Un equipo de proveedores de atencin mdica la ayudar antes, durante y despus del parto vaginal. Las experiencias de los nacimientos son nicas para todas las mujeres y cada embarazo y las experiencias de nacimiento varan segn dnde elija dar a luz. Qu debo hacer a fin de prepararme para el nacimiento de mi beb? Antes del nacimiento de su beb, es importante que hable con su mdico sobre lo siguiente:  Sus preferencias en cuanto al trabajo de parto y parto. Estas pueden incluir lo siguiente: ? Medicamentos que le puedan administrar. ? Cmo controlar el dolor. Esto podra incluir tcnicas de alivio del dolor no mdicas o medicamentos inyectables para aliviar el dolor como la analgesia epidural. ? Cmo se los controlar a usted y a su beb durante el trabajo de parto y el parto. ? Quin puede estar en la sala de trabajo de parto y parto con usted. ? Sus opiniones en cuanto al parto quirrgico de su beb (parto por cesrea o cesrea) si esto fuera necesario. ? Sus opiniones en cuanto a recibir sangre donada a travs de un tubo (catter) intravenoso (transfusin de sangre) si esto fuera necesario.  Si usted puede: ? Tomar fotografas o videos del nacimiento. ? Comer durante el trabajo de parto y el parto. ? Moverse, caminar o cambiar de posicin durante el trabajo de parto y el parto.  Qu esperar despus del nacimiento de su beb, como: ? Si se ofrece el pinzamiento y corte tardo del cordn umbilical. ? Quin cuidar a su beb inmediatamente despus del nacimiento. ? Medicamentos o pruebas que pueden recomendarse para su beb. ? Si su hospital o centro de parto apoya la lactancia. ? Cunto tiempo estar en el hospital o en el centro de parto.  Cmo cualquier afeccin mdica que usted tenga puede afectar a su beb o la experiencia de trabajo de parto y  parto.  A fin de prepararse para el nacimiento de su beb, usted tambin debe:  Asistir a todas sus visitas de atencin mdica antes del parto (visitas prenatales) segn lo recomendado por su mdico. Esto es importante.  Preparacin del hogar para la llegada de su beb. Asegrese de tener lo siguiente: ? Paales. ? Ropa de beb. ? Equipo de alimentacin. ? Haga preparativos para que usted y su beb puedan dormir de manera segura.  Instale un asiento de beb en su vehculo. Haga verificar el asiento de beb de su coche por un instalador de asientos de beb para asegurarse de que est instalado en forma segura.  Piense en quin la ayudar con su nuevo beb en el hogar durante al menos las primeras semanas despus del parto.  Qu puedo esperar cuando llegue al centro de parto o el hospital? Una vez que se inicie el trabajo de parto y haya sido admitida en el hospital o centro de parto, el mdico podr hacer lo siguiente:  Revisar sus antecedentes de embarazo y cualquier inquietud que usted pueda tener.  Colocar un tubo (catter) intravenoso en una de sus venas. Esto se usa para administrarle lquidos y medicamentos.  Verificar su presin arterial, temperatura y pulso y la frecuencia cardaca (signos vitales).  Verificar si la bolsa de agua (saco amnitico) se ha roto (ruptura).  Hablar con usted sobre su plan de nacimiento y analizar las opciones para controlar el dolor.  Monitoreo Su mdico puede monitorear las contracciones (monitoreo uterino) y   el ritmo cardaco del beb (monitoreo fetal). Es posible que el monitoreo se necesite realizar:  Con frecuencia, pero no continuamente (intermitentemente).  Todo el tiempo o durante largos perodos a la vez (continuamente). El monitoreo continuo puede ser necesario si: ? Usted est recibiendo determinados medicamentos, tales como medicamentos para aliviar el dolor o para hacer que las contracciones ms fuertes. ? Tiene complicaciones  durante el embarazo o el trabajo de parto.  El monitoreo se puede realizar:  Al colocar un estetoscopio especial o un dispositivo manual de monitoreo en el abdomen o verificar los latidos cardacos de su beb y sentir su abdomen para controlar de contracciones. Este mtodo de monitoreo no registra los latidos cardacos de su beb ni sus contracciones de manera continua.  Colocar monitores en el abdomen (monitores externos) para registrar los latidos cardacos de su beb y la frecuencia y duracin de las contracciones. No tendr que tener colocados los monitores externos en todo momento.  Colocar monitores dentro del tero (monitores internos) para registrar los latidos cardacos de su beb y la frecuencia, duracin y fuerza de sus contracciones. ? Su mdico podr usar monitores internos si necesita ms informacin sobre la fuerza de las contracciones o la frecuencia cardaca del beb. ? Los monitores internos se colocan pasando un cable delgado y flexible a travs de la vagina hasta el tero. Segn el tipo de monitor, puede permanecer en el tero o en la cabeza del beb hasta el nacimiento. ? Su mdico analizar con usted los beneficios y los riesgos de usar un monitor interno y le pedir permiso antes de colocar los monitores.  Telemetra. Se trata de un tipo de monitoreo continuo que se puede realizar con monitores externos o internos. En lugar de tener que permanecer en la cama, usted puede moverse durante la telemetra. Pregunte a su mdico si la telemetra es una opcin para usted.  Examen fsico. Su mdico puede realizarle un examen fsico. Esto puede incluir lo siguiente:  Verificar en qu posicin se encuentra su beb: ? Con la cabeza hacia la vagina (cabeza abajo). Esta es la posicin ms frecuente. ? Con la cabeza hacia la parte superior del tero (cabeza arriba o de nalgas). Si su beb est en una posicin de nalgas, su mdico puede tratar de hacerlo girar para que quede cabeza abajo a  fin de poder tener un parto vaginal. Si parece que su beb no puede nacer con parto vaginal, su mdico puede recomendar una ciruga para dar a luz al beb. En casos muy poco frecuentes, usted puede dar a luz con parto vaginal si el beb est cabeza arriba (parto de nalgas). ? Posicin lateral (transversal). Los bebs que estn en posicin lateral no pueden nacer por parto vaginal.  Verificar el cuello uterino para determinar: ? Si se est afinando o estirando (borrando). ? Si se est abriendo (dilatando). ? Cunto se ha movido o ha bajado su beb por el canal del parto.  Cules son las tres etapas del trabajo de parto y el parto?  El trabajo de parto y el parto normales se dividen en tres etapas: Etapa 1  La etapa 1 es la etapa ms larga del trabajo de parto y puede durar horas o das. La etapa 1 incluye: ? Trabajo de parto temprano. Esto es cuando las contracciones pueden ser irregulares o regulares y leves. En general, las contracciones del trabajo de parto temprano se producen con ms de 10 minutos de diferencia. ? Trabajo de parto activo. Esto es cuando las   contracciones son ms largas, ms regulares, ms frecuentes y ms intensas. ? La fase de transicin. Esto es cuando las contracciones ocurren muy seguidas, son muy intensas y pueden durar ms que durante cualquier otra parte del trabajo de parto.  En general, las contracciones son leves, infrecuentes e irregulares al principio. Se hacen ms fuertes, ms frecuentes (aproximadamente cada 2 o 3 minutos) y ms regulares a medida que usted avanza desde un trabajo de parto temprano hasta un trabajo de parto activo y fase de transicin.  Muchas mujeres progresan a travs de la etapa 1 de forma natural, pero es posible que usted necesite ayuda para continuar avanzando. Si esto ocurre, su mdico puede hablar con usted sobre lo siguiente: ? La ruptura del saco amnitico si es que an no ha ocurrido. ? Administracin de medicamentos para ayudarla  a tener contracciones ms fuertes y ms frecuentes.  La etapa 1 finaliza cuando el cuello uterino est completamente dilatado hasta 4 pulgadas (10cm) y completamente borrado. Esto ocurre al final de la fase de transicin. Etapa 2  Una vez que el cuello uterino est totalmente borrado y dilatado a 4 pulgadas (10cm), usted puede comenzar a sentir ganas de pujar. Es comn que el cuerpo tome un descanso de manera natural antes de sentir ganas de pujar, especialmente si recibe una epidural u otros medicamentos para el dolor. Este perodo de descanso puede durar un mximo de 1 a 2 horas, segn su experiencia de parto nica.  Durante la etapa 2, las contracciones son generalmente menos doloras porque pujar ayuda a aliviar el dolor de las contracciones. En lugar del dolor de las contracciones, puede sentir un dolor urente y por estiramiento, especialmente cuando la parte ms ancha de la cabeza de su beb pasa a travs de la abertura vaginal (coronacin).  Su mdico controlar atentamente su avance con los pujos y el avance del beb a travs de la vagina durante la etapa 2.  Su mdico puede masajear el rea de la piel entre la abertura vaginal y el ano (perineo) o aplicar compresas tibias en el perineo. Esto ayuda al estiramiento ya que la cabeza del beb empieza a aparecer, lo cual puede ayudar a evitar un desgarro perineal. ? En algunos casos, se puede hacer una incisin en el peritoneo (episiotoma) para permitir que el beb pase a travs de la abertura vaginal. La episiotoma sirve para agrandar la abertura vaginal a fin de que el beb tenga ms espacio para pasar durante el parto.  Es muy importante respirar y concentrarse para que el mdico pueda controlar la salida de la cabeza del beb. Es posible que su mdico tenga que disminuir la intensidad de los pujos para ayudar a evitar un desgarro perineal.  Despus de que sale la cabeza del beb, en general salen los hombros y el resto del cuerpo muy  rpidamente y sin dificultad.  Una vez que el beb nace, se debe cortar el cordn umbilical de inmediato o esto puede demorar 1 o 2 minutos, segn la salud del beb. Este procedimiento puede variar segn el mdico, el hospital y el centro de parto.  Si usted y su beb estn lo suficientemente sanos, se le colocar el beb en el pecho o el abdomen para ayudar a mantener la temperatura del beb y el vnculo entre ustedes. Algunas madres y bebs comienzan la lactancia en este momento. Su equipo mdico secar al beb y lo ayudar a mantenerse caliente durante este tiempo.  Es posible que su beb necesite atencin   inmediata si: ? Mostr signos de sufrimiento durante el trabajo de parto. ? Tiene una afeccin mdica. ? Naci antes de tiempo (prematuro). ? Defeca antes del nacimiento (meconio). ? Muestra signos de dificultar en la transicin de estar dentro del tero a estar fuera del tero. Si tiene planeado amamantar, su equipo mdico la ayudar a comenzar la lactancia. Etapa 3  La tercera etapa del trabajo de parto comienza inmediatamente despus del nacimiento de su beb y finaliza despus de la expulsin de la placenta. La placenta es un rgano de que desarrolla durante el embarazo para proporcionar oxgeno y nutrientes al beb en el tero.  La expulsin de la placenta puede requerir algunos pujos y es posible que usted tenga contracciones leves. La lactancia puede estimular las contracciones para ayudar a expulsar la placenta.  Luego de la expulsin de la placenta, el tero debe (contraerse) y quedar muy firme. Esto ayuda a detener el sangrado en el tero. Para ayudar al tero a contraerse y controlar el sangrado, su mdico puede: ? Administrarle un medicamento inyectable, a travs de un tubo (catter) intravenoso, por boca o a travs del recto (por va rectal). ? Masajear el abdomen o realizar un examen de la vagina para extraer cualquier cogulo de sangre que quede en el tero. ? Vaciar la  vejiga colocando un tubo flexible (catter) en la vejiga. ? Alentarla a amamantar a su beb. Una vez que termina el trabajo de parto, se los controlar a usted y a su beb atentamente para tener la seguridad de que ambos estn sanos hasta que estn listos para ir a casa. Su equipo de atencin mdica le ensear cmo cuidarse y cuidar a su beb. Esta informacin no tiene como fin reemplazar el consejo del mdico. Asegrese de hacerle al mdico cualquier pregunta que tenga. Document Released: 04/05/2008 Document Revised: 08/02/2016 Document Reviewed: 05/08/2015 Elsevier Interactive Patient Education  2018 Elsevier Inc.  

## 2017-05-02 NOTE — Progress Notes (Signed)
Subjective:  Tiffany Lamb is a 36 y.o. G7P6006 at 933w3d being seen today for ongoing prenatal care.  She is currently monitored for the following issues for this high-risk pregnancy and has Depression affecting pregnancy in second trimester, antepartum; Maternal pregestational diabetes classes B through R, antepartum; Supervision of high risk pregnancy, antepartum; Advanced maternal age in multigravida; and Grand multiparity on their problem list.  Patient reports no complaints.  Contractions: Not present. Vag. Bleeding: None.  Movement: Present. Denies leaking of fluid.   The following portions of the patient's history were reviewed and updated as appropriate: allergies, current medications, past family history, past medical history, past social history, past surgical history and problem list. Problem list updated.  Objective:   Vitals:   05/02/17 1008  BP: 115/60  Pulse: 100  Weight: 109 kg (240 lb 4.8 oz)    Fetal Status: Fetal Heart Rate (bpm): 138   Movement: Present     General:  Alert, oriented and cooperative. Patient is in no acute distress.  Skin: Skin is warm and dry. No rash noted.   Cardiovascular: Normal heart rate noted  Respiratory: Normal respiratory effort, no problems with respiration noted  Abdomen: Soft, gravid, appropriate for gestational age. Pain/Pressure: Present     Pelvic:  Cervical exam performed        Extremities: Normal range of motion.  Edema: Trace  Mental Status: Normal mood and affect. Normal behavior. Normal judgment and thought content.   Urinalysis:      Assessment and Plan:  Pregnancy: G7P6006 at 3933w3d  1. Elderly multigravida in third trimester   2. Depression affecting pregnancy in second trimester, antepartum Pt has not been taking medications d/t cost [facility] made ( they said can fill medications)  3. Maternal pregestational diabetes classes B through R, antepartum Pt has not been checking CBG's and taking  medication d/t cost Importance of checking CBG's and taking meds reviewed with pt See above regarding medications Will start twice weekly antenatal testing - US MFM FETAL BPP WO NON STRESS; Future  4. Supervision of high risk pregnancy, antepartum Labor precautions - Strep Gp B NAA - Cervicovaginal ancillary only  Preterm labor symptoms and general obstetric precautions including but not limited to vaginal bleeding, contractions, leaking of fluid and fetal movement were reviewed in detail with the patient. Please refer to After Visit Summary for other counseling recommendations.  Return in about 1 week (around 05/09/2017) for OB visit.   Hermina StaggersErvin, Chayden Garrelts L, MD

## 2017-05-02 NOTE — Addendum Note (Signed)
Addended by: Garret ReddishBARNES, Darvis Croft M on: 05/02/2017 04:27 PM   Modules accepted: Orders

## 2017-05-03 ENCOUNTER — Other Ambulatory Visit: Payer: Self-pay | Admitting: Obstetrics and Gynecology

## 2017-05-03 ENCOUNTER — Ambulatory Visit (HOSPITAL_COMMUNITY)
Admission: RE | Admit: 2017-05-03 | Discharge: 2017-05-03 | Disposition: A | Discharge status: Home / Self Care | Payer: Self-pay | Source: Ambulatory Visit | Attending: Obstetrics & Gynecology | Admitting: Obstetrics & Gynecology

## 2017-05-03 DIAGNOSIS — O24313 Unspecified pre-existing diabetes mellitus in pregnancy, third trimester: Secondary | ICD-10-CM

## 2017-05-03 DIAGNOSIS — O09299 Supervision of pregnancy with other poor reproductive or obstetric history, unspecified trimester: Secondary | ICD-10-CM | POA: Insufficient documentation

## 2017-05-03 DIAGNOSIS — Z362 Encounter for other antenatal screening follow-up: Secondary | ICD-10-CM | POA: Insufficient documentation

## 2017-05-03 DIAGNOSIS — O09523 Supervision of elderly multigravida, third trimester: Secondary | ICD-10-CM

## 2017-05-03 DIAGNOSIS — O289 Unspecified abnormal findings on antenatal screening of mother: Secondary | ICD-10-CM | POA: Insufficient documentation

## 2017-05-03 DIAGNOSIS — Z3A35 35 weeks gestation of pregnancy: Secondary | ICD-10-CM

## 2017-05-03 DIAGNOSIS — O24319 Unspecified pre-existing diabetes mellitus in pregnancy, unspecified trimester: Secondary | ICD-10-CM

## 2017-05-03 LAB — CERVICOVAGINAL ANCILLARY ONLY
CHLAMYDIA, DNA PROBE: NEGATIVE
NEISSERIA GONORRHEA: NEGATIVE

## 2017-05-03 LAB — OB RESULTS CONSOLE GBS: STREP GROUP B AG: NEGATIVE

## 2017-05-04 LAB — STREP GP B NAA: STREP GROUP B AG: NEGATIVE

## 2017-05-07 NOTE — L&D Delivery Note (Addendum)
Delivery Note At 8:19 PM a viable female was delivered via Vaginal, Spontaneous (Presentation: vertex; LOA) over an intact perineum.  APGAR:8,9 ; weight pending.   Placenta status: intact, WNL.  Cord: 3V without complications.  Cord pH: not sent.  Shoulder and body delivered in usual fashion. Infant placed on mother's abdomen, dried and bulb suctioned. Cord clamped x 2 after 1-minute delay, and cut by family member. Cord blood drawn. After 1 minute, the cord was clamped and cut. 40 units of pitocin diluted in 1000cc LR was infused rapidly IV.  The placenta separated spontaneously and delivered via CCT and maternal pushing effort.  It was inspected and appears to be intact with a 3 VC.    Anesthesia: epidural  Episiotomy: None Lacerations:  1st deg Suture Repair: 3.0 monocryl Est. Blood Loss (mL):  200  Mom to postpartum.  Baby to Couplet care / Skin to Skin.  Alroy Bailiffarker W Leland 05/27/2017, 8:41 PM  I confirm that I have verified the information documented in the resident's note and that I have also personally reperformed the physical exam and all medical decision making activities.  I was gloved and present for entire delivery SVD without incident No difficulty with shoulders  Lacerations as listed above Repair of same supervised by me Aviva SignsMarie L Zakyla Tonche, CNM  Please schedule this patient for Postpartum visit in: 4 weeks with the following provider: Any provider For C/S patients schedule nurse incision check in weeks 2 weeks: no High risk pregnancy complicated by: GDM Delivery mode:  SVD Anticipated Birth Control:  other/unsure PP Procedures needed: 2 hour GTT  Schedule Integrated BH visit: yes

## 2017-05-09 NOTE — Telephone Encounter (Signed)
error 

## 2017-05-13 ENCOUNTER — Other Ambulatory Visit: Payer: Self-pay

## 2017-05-13 ENCOUNTER — Inpatient Hospital Stay (HOSPITAL_COMMUNITY): Payer: Self-pay

## 2017-05-13 ENCOUNTER — Ambulatory Visit (INDEPENDENT_AMBULATORY_CARE_PROVIDER_SITE_OTHER): Payer: Self-pay | Admitting: Family Medicine

## 2017-05-13 ENCOUNTER — Encounter (HOSPITAL_COMMUNITY): Payer: Self-pay | Admitting: Emergency Medicine

## 2017-05-13 ENCOUNTER — Inpatient Hospital Stay (HOSPITAL_COMMUNITY)
Admission: AD | Admit: 2017-05-13 | Discharge: 2017-05-13 | Disposition: A | Discharge status: Home / Self Care | Payer: Self-pay | Source: Ambulatory Visit | Attending: Obstetrics & Gynecology | Admitting: Obstetrics & Gynecology

## 2017-05-13 VITALS — BP 104/70 | HR 93 | Wt 241.8 lb

## 2017-05-13 DIAGNOSIS — O0993 Supervision of high risk pregnancy, unspecified, third trimester: Secondary | ICD-10-CM

## 2017-05-13 DIAGNOSIS — O26893 Other specified pregnancy related conditions, third trimester: Secondary | ICD-10-CM | POA: Insufficient documentation

## 2017-05-13 DIAGNOSIS — R531 Weakness: Secondary | ICD-10-CM | POA: Insufficient documentation

## 2017-05-13 DIAGNOSIS — O0990 Supervision of high risk pregnancy, unspecified, unspecified trimester: Secondary | ICD-10-CM

## 2017-05-13 DIAGNOSIS — O368130 Decreased fetal movements, third trimester, not applicable or unspecified: Secondary | ICD-10-CM

## 2017-05-13 DIAGNOSIS — O09523 Supervision of elderly multigravida, third trimester: Secondary | ICD-10-CM | POA: Insufficient documentation

## 2017-05-13 DIAGNOSIS — Z3A37 37 weeks gestation of pregnancy: Secondary | ICD-10-CM

## 2017-05-13 DIAGNOSIS — Z91040 Latex allergy status: Secondary | ICD-10-CM | POA: Insufficient documentation

## 2017-05-13 DIAGNOSIS — O24419 Gestational diabetes mellitus in pregnancy, unspecified control: Secondary | ICD-10-CM | POA: Insufficient documentation

## 2017-05-13 DIAGNOSIS — O99343 Other mental disorders complicating pregnancy, third trimester: Secondary | ICD-10-CM | POA: Insufficient documentation

## 2017-05-13 DIAGNOSIS — Z7984 Long term (current) use of oral hypoglycemic drugs: Secondary | ICD-10-CM | POA: Insufficient documentation

## 2017-05-13 DIAGNOSIS — O24313 Unspecified pre-existing diabetes mellitus in pregnancy, third trimester: Secondary | ICD-10-CM

## 2017-05-13 DIAGNOSIS — R42 Dizziness and giddiness: Secondary | ICD-10-CM | POA: Insufficient documentation

## 2017-05-13 DIAGNOSIS — F329 Major depressive disorder, single episode, unspecified: Secondary | ICD-10-CM | POA: Insufficient documentation

## 2017-05-13 DIAGNOSIS — O288 Other abnormal findings on antenatal screening of mother: Secondary | ICD-10-CM

## 2017-05-13 DIAGNOSIS — O24319 Unspecified pre-existing diabetes mellitus in pregnancy, unspecified trimester: Secondary | ICD-10-CM

## 2017-05-13 DIAGNOSIS — Z8249 Family history of ischemic heart disease and other diseases of the circulatory system: Secondary | ICD-10-CM | POA: Insufficient documentation

## 2017-05-13 DIAGNOSIS — D649 Anemia, unspecified: Secondary | ICD-10-CM | POA: Insufficient documentation

## 2017-05-13 DIAGNOSIS — R110 Nausea: Secondary | ICD-10-CM | POA: Insufficient documentation

## 2017-05-13 DIAGNOSIS — Z7982 Long term (current) use of aspirin: Secondary | ICD-10-CM | POA: Insufficient documentation

## 2017-05-13 DIAGNOSIS — Z8379 Family history of other diseases of the digestive system: Secondary | ICD-10-CM | POA: Insufficient documentation

## 2017-05-13 DIAGNOSIS — O99013 Anemia complicating pregnancy, third trimester: Secondary | ICD-10-CM | POA: Insufficient documentation

## 2017-05-13 DIAGNOSIS — Z641 Problems related to multiparity: Secondary | ICD-10-CM

## 2017-05-13 DIAGNOSIS — Z79899 Other long term (current) drug therapy: Secondary | ICD-10-CM | POA: Insufficient documentation

## 2017-05-13 LAB — URINALYSIS, ROUTINE W REFLEX MICROSCOPIC
Bilirubin Urine: NEGATIVE
Glucose, UA: NEGATIVE mg/dL
Hgb urine dipstick: NEGATIVE
KETONES UR: 5 mg/dL — AB
Nitrite: NEGATIVE
PROTEIN: 30 mg/dL — AB
Specific Gravity, Urine: 1.027 (ref 1.005–1.030)
pH: 5 (ref 5.0–8.0)

## 2017-05-13 LAB — POCT URINALYSIS DIP (DEVICE)
GLUCOSE, UA: NEGATIVE mg/dL
Hgb urine dipstick: NEGATIVE
Nitrite: NEGATIVE
Protein, ur: 100 mg/dL — AB
Specific Gravity, Urine: >=1.03 (ref 1.005–1.030)
Urobilinogen, UA: 0.2 mg/dL (ref 0.0–1.0)
pH: 6 (ref 5.0–8.0)

## 2017-05-13 NOTE — MAU Note (Signed)
Pt here with c/o decreased fetal movement after taking medication about 1800 last night. Feeling dizzy, diarrhea since about 0400. Denies any bleeding or leaking of fluid.

## 2017-05-13 NOTE — Progress Notes (Signed)
Subjective:  Tiffany Lamb is a 37 y.o. G7P6006 at 1764w0d being seen today for ongoing prenatal care.  She is currently monitored for the following issues for this high-risk pregnancy and has Depression affecting pregnancy in second trimester, antepartum; Maternal pregestational diabetes classes B through R, antepartum; Supervision of high risk pregnancy, antepartum; Advanced maternal age in multigravida; and Grand multiparity on their problem list.  GDM: Patient taking glyburide 2.5mg  BID.  Reports no hypoglycemic episodes.  Tolerating medication well. Patient did not bring log, but reports the following: Fasting: 60-70 2hr PP: <120, occasionally a little elevated.  Patient reports nausea.  Contractions: Not present. Vag. Bleeding: None.  Movement: Present. Denies leaking of fluid.   The following portions of the patient's history were reviewed and updated as appropriate: allergies, current medications, past family history, past medical history, past social history, past surgical history and problem list. Problem list updated.  Objective:   Vitals:   05/13/17 0901  BP: 104/70  Pulse: 93  Weight: 241 lb 12.8 oz (109.7 kg)    Fetal Status: Fetal Heart Rate (bpm): 141   Movement: Present     General:  Alert, oriented and cooperative. Patient is in no acute distress.  Skin: Skin is warm and dry. No rash noted.   Cardiovascular: Normal heart rate noted  Respiratory: Normal respiratory effort, no problems with respiration noted  Abdomen: Soft, gravid, appropriate for gestational age. Pain/Pressure: Absent     Pelvic: Vag. Bleeding: None     Cervical exam deferred        Extremities: Normal range of motion.  Edema: Trace  Mental Status: Normal mood and affect. Normal behavior. Normal judgment and thought content.   Urinalysis: Urine Protein: 2+ Urine Glucose: Negative  Assessment and Plan:  Pregnancy: G7P6006 at 4064w0d  1. Supervision of high risk pregnancy, antepartum FHT  normal  2. Maternal pregestational diabetes classes B through R, antepartum Glyburide BID. Had antenatal testing this AM: 8/8 BPP. US follow up on 1/11 for growth.  3. Elderly multigravida in third trimester  Term labor symptoms and general obstetric precautions including but not limited to vaginal bleeding, contractions, leaking of fluid and fetal movement were reviewed in detail with the patient. Please refer to After Visit Summary for other counseling recommendations.  Return in about 1 week (around 05/20/2017) for HR OB f/u.   Levie HeritageStinson, Lutisha Knoche J, DO

## 2017-05-13 NOTE — Progress Notes (Signed)
PT states yesetrday the baby was not moving, so came to the hospital this am  to make sure everything was ok & they verified it was, also was feeling faint, had diarrhea.

## 2017-05-13 NOTE — MAU Provider Note (Signed)
History     CSN: [id number]  Arrival date and time: 05/13/17 0602   First Provider Initiated Contact with Patient 05/13/17 [phone]      Chief Complaint  Patient presents with  . Decreased Fetal Movement   G7P6006 @37  wks here with decreased FM, nausea, dizziness, weakness, and HA. All sx started after she took a new medication last night-Zoloft. She is feeling FM but less.    OB History    Gravida Para Term Preterm AB Living   7 6 6     6    SAB TAB Ectopic Multiple Live Births           6      Past Medical History:  Diagnosis Date  . Anemia   . Depression   . Gestational diabetes     Past Surgical History:  Procedure Laterality Date  . gall bladder  2007    Family History  Problem Relation Age of Onset  . Hepatitis Mother   . Heart disease Brother   . Cancer Paternal Aunt        breast  . Cancer Paternal Grandmother        liver  . Cancer Paternal Grandfather        gastric  . Diabetes Paternal Grandfather     Social History   Tobacco Use  . Smoking status: Never Smoker  . Smokeless tobacco: Never Used  Substance Use Topics  . Alcohol use: No  . Drug use: No    Allergies:  Allergies  Allergen Reactions  . Latex Other (See Comments)    "throat starts closing up"    Medications Prior to Admission  Medication Sig Dispense Refill Last Dose  . aspirin EC 81 MG tablet Take 1 tablet (81 mg total) by mouth daily. 100 tablet 1 Taking  . famotidine (PEPCID) 20 MG tablet Take 1 tablet (20 mg total) by mouth 2 (two) times daily. 60 tablet 3   . Ferrous Sulfate (IRON) 325 (65 Fe) MG TABS Take 1 tablet (325 mg total) by mouth 2 (two) times daily. 30 each 5   . glyBURIDE (DIABETA) 2.5 MG tablet Take 1 tablet (2.5 mg total) by mouth 2 (two) times daily with a meal. 60 tablet 3   . haloperidol (HALDOL) 2 MG tablet 1/2 to 1 tablet at bedtime, every night   Not Taking  . Prenatal Multivit-Min-Fe-FA (PRENATAL VITAMINS) 0.8 MG tablet Take 1 tablet by mouth daily. 30  tablet 12   . promethazine (PHENERGAN) 25 MG tablet Take 0.5 tablets (12.5 mg total) every 6 (six) hours as needed by mouth for nausea or vomiting. (Patient not taking: Reported on 05/02/2017) 30 tablet 0 Not Taking  . sertraline (ZOLOFT) 50 MG tablet Take 50 mg by mouth.   Not Taking    Review of Systems  Gastrointestinal: Positive for nausea. Negative for abdominal pain and vomiting.  Neurological: Positive for dizziness, weakness and headaches.   Physical Exam   Blood pressure 101/65, pulse 77, temperature 98.1 F (36.7 C), temperature source Oral, resp. rate 18, height 5\' 2"  (1.575 m), weight 244 lb (110.7 kg), last menstrual period 08/13/2016, SpO2 98 %.  Physical Exam  Nursing note and vitals reviewed. Constitutional: She is oriented to person, place, and time. She appears well-developed and well-nourished. No distress.  HENT:  Head: Normocephalic and atraumatic.  Neck: Normal range of motion.  Cardiovascular: Normal rate.  Respiratory: Effort normal.  Musculoskeletal: Normal range of motion.  Neurological: She is alert and  oriented to person, place, and time.  Skin: Skin is warm and dry.  Psychiatric: She has a normal mood and affect.  EFM: 135 bpm, mod variability, + accels, no decels Toco: rare  Results for orders placed or performed during the hospital encounter of 05/13/17 (from the past 24 hour(s))  Urinalysis, Routine w reflex microscopic     Status: Abnormal   Collection Time: 05/13/17  6:38 AM  Result Value Ref Range   Color, Urine YELLOW YELLOW   APPearance HAZY (A) CLEAR   Specific Gravity, Urine 1.027 1.005 - 1.030   pH 5.0 5.0 - 8.0   Glucose, UA NEGATIVE NEGATIVE mg/dL   Hgb urine dipstick NEGATIVE NEGATIVE   Bilirubin Urine NEGATIVE NEGATIVE   Ketones, ur 5 (A) NEGATIVE mg/dL   Protein, ur 30 (A) NEGATIVE mg/dL   Nitrite NEGATIVE NEGATIVE   Leukocytes, UA SMALL (A) NEGATIVE   RBC / HPF 0-5 0 - 5 RBC/hpf   WBC, UA 0-5 0 - 5 WBC/hpf   Bacteria, UA RARE  (A) NONE SEEN   Squamous Epithelial / LPF 6-30 (A) NONE SEEN   Mucus PRESENT     MAU Course  Procedures  MDM NRNST, BPP ordered. Transfer of care given to M.Mayford KnifeWilliams, CNM Donette LarryBhambri, Melanie, CNM  05/13/2017 8:14 AM   Assessment and Plan   Video interpreter used for encounter  BPP 8/8 Will discharge home Has appt this morning Aviva SignsWilliams, Geraldene Eisel L, CNM

## 2017-05-13 NOTE — Progress Notes (Signed)
Pt has appointment in clinic this morning.  Spoke with CNM about talking with counselor in clinic about nightmares and fears.  Pt agreed she would speak with counselor at appointment.  Discharged home in good condition.  Pt states questions answered and agrees with discharge plan.

## 2017-05-13 NOTE — Discharge Instructions (Signed)

## 2017-05-17 ENCOUNTER — Ambulatory Visit (HOSPITAL_COMMUNITY)
Admission: RE | Admit: 2017-05-17 | Discharge: 2017-05-17 | Disposition: A | Discharge status: Home / Self Care | Payer: Self-pay | Source: Ambulatory Visit | Attending: Obstetrics & Gynecology | Admitting: Obstetrics & Gynecology

## 2017-05-17 ENCOUNTER — Encounter (HOSPITAL_COMMUNITY): Payer: Self-pay

## 2017-05-17 ENCOUNTER — Other Ambulatory Visit (HOSPITAL_COMMUNITY): Payer: Self-pay | Admitting: Obstetrics and Gynecology

## 2017-05-17 DIAGNOSIS — O09523 Supervision of elderly multigravida, third trimester: Secondary | ICD-10-CM | POA: Insufficient documentation

## 2017-05-17 DIAGNOSIS — O99213 Obesity complicating pregnancy, third trimester: Secondary | ICD-10-CM | POA: Insufficient documentation

## 2017-05-17 DIAGNOSIS — Z6841 Body Mass Index (BMI) 40.0 and over, adult: Secondary | ICD-10-CM | POA: Insufficient documentation

## 2017-05-17 DIAGNOSIS — O24313 Unspecified pre-existing diabetes mellitus in pregnancy, third trimester: Secondary | ICD-10-CM

## 2017-05-17 DIAGNOSIS — O24919 Unspecified diabetes mellitus in pregnancy, unspecified trimester: Secondary | ICD-10-CM

## 2017-05-17 DIAGNOSIS — O289 Unspecified abnormal findings on antenatal screening of mother: Secondary | ICD-10-CM | POA: Insufficient documentation

## 2017-05-17 DIAGNOSIS — O09299 Supervision of pregnancy with other poor reproductive or obstetric history, unspecified trimester: Secondary | ICD-10-CM | POA: Insufficient documentation

## 2017-05-17 DIAGNOSIS — Z641 Problems related to multiparity: Secondary | ICD-10-CM

## 2017-05-17 DIAGNOSIS — Z3A37 37 weeks gestation of pregnancy: Secondary | ICD-10-CM | POA: Insufficient documentation

## 2017-05-20 ENCOUNTER — Ambulatory Visit (INDEPENDENT_AMBULATORY_CARE_PROVIDER_SITE_OTHER): Payer: Self-pay | Admitting: *Deleted

## 2017-05-20 ENCOUNTER — Encounter: Payer: Self-pay | Admitting: Obstetrics & Gynecology

## 2017-05-20 ENCOUNTER — Telehealth (HOSPITAL_COMMUNITY): Payer: Self-pay | Admitting: *Deleted

## 2017-05-20 ENCOUNTER — Ambulatory Visit (INDEPENDENT_AMBULATORY_CARE_PROVIDER_SITE_OTHER): Payer: Self-pay | Admitting: Obstetrics & Gynecology

## 2017-05-20 ENCOUNTER — Ambulatory Visit: Payer: Self-pay

## 2017-05-20 VITALS — BP 98/68 | HR 119 | Wt 243.4 lb

## 2017-05-20 DIAGNOSIS — O24319 Unspecified pre-existing diabetes mellitus in pregnancy, unspecified trimester: Secondary | ICD-10-CM

## 2017-05-20 DIAGNOSIS — O0993 Supervision of high risk pregnancy, unspecified, third trimester: Secondary | ICD-10-CM

## 2017-05-20 DIAGNOSIS — O24313 Unspecified pre-existing diabetes mellitus in pregnancy, third trimester: Secondary | ICD-10-CM

## 2017-05-20 DIAGNOSIS — O0990 Supervision of high risk pregnancy, unspecified, unspecified trimester: Secondary | ICD-10-CM

## 2017-05-20 DIAGNOSIS — O09523 Supervision of elderly multigravida, third trimester: Secondary | ICD-10-CM

## 2017-05-20 MED ORDER — PANTOPRAZOLE SODIUM 40 MG PO TBEC: 40.0000 mg | DELAYED_RELEASE_TABLET | Freq: Every day | ORAL | 1 refills | Status: DC

## 2017-05-20 NOTE — Telephone Encounter (Signed)
Preadmission screen Interpreter number [phone]

## 2017-05-20 NOTE — Progress Notes (Signed)
Interpreter Tiffany Lamb present for encounter.  Pt has nausea and vomiting in the morning x2 days.  She also has had some low blood sugar readings. Pt reports feeling bad after taking Zoloft on time on 1/6 - baby didn't move for many hours and she went to MAU for evaluation.

## 2017-05-20 NOTE — Patient Instructions (Signed)
Induccin del trabajo de parto  (Labor Induction)  Se denomina induccin del trabajo de parto cuando se inician acciones para hacer que una mujer embarazada comience el trabajo de parto. La mayora de las mujeres comienzan el trabajo de parto sin ayuda entre las semanas 37 y 42 del embarazo. Cuando esto no ocurre o cuando hay una necesidad mdica, pueden utilizarse diferentes mtodos para inducirlo. La induccin del trabajo de parto hace que el tero se contraiga. Tambin hace que el cuello del tero se ablandemadure), se abra (se dilate), y se afine (se borre). Generalmente el trabajo de parto no se induce antes de las 39 semanas excepto que haya un problema con el beb o con la madre.  Antes de inducir el trabajo de parto, el mdico considerar cierto nmero de factores incluyendo los siguientes:   El estado del beb.   Cuntas semanas tiene de embarazo.   La madurez de los pulmones del beb.   El estado del cuello del tero.   La posicin del beb.  CULES SON LOS MOTIVOS PARA INDUCIR UN PARTO?  El trabajo de parto puede inducirse por las siguientes razones:   La salud del beb o de la madre estn en riesgo.   El embarazo se ha pasado de trmino en 1 semana o ms.   Ha roto la bolsa de aguas pero no se ha iniciado el trabajo de parto por s mismo.   La madre tiene algn trastorno de salud o una enfermedad grave, como hipertensin arterial, una infeccin, desprendimiento abrupto de la placenta o diabetes.   Hay escaso lquido amnitico alrededor del beb.   El beb presenta sufrimiento.  La conveniencia o el deseo de que el beb nazca en una cierta fecha no es un motivo para inducir el parto.  CULES SON LOS MTODOS UTILIZADOS PARA INDUCIR EL TRABAJO DE PARTO?  Algunos mtodos de induccin del trabajo de parto son:   Administracin del medicamentos prostaglandina. Este medicamento hace que el cuello uterino se dilate y madure. Este medicamento tambin iniciar las contracciones. Puede tomarse por  boca o insertarse en la vagina en forma de supositorio.   Insercin en la vagina de un tubo delgado (catter) con un baln en el extremo para dilatar el cuello del tero. Una vez insertado, el baln se infla con agua, lo que provoca la apertura del cuello del tero.   Ruptura de las membranas. El mdico separa el saco amnitico del cuello uterino, haciendo que el cuello uterino se distienda y cause la liberacin de la hormona llamada progesterona. Esto hace que el tero se contraiga. Este procedimiento se realiza durante una visita al consultorio mdico. Le indicarn que vuelva a su casa y espere que se inicien las contracciones. Luego tendr que volver para la induccin.   Ruptura de la bolsa de aguas. El mdico romper el saco amnitico con un pequeo instrumento. Una vez que el saco amnitico se rompe, las contracciones deben comenzar. Pueden pasar algunas horas hasta que haga efecto.   Medicamentos que desencadenen o intensifiquen las contracciones. Se lo administrarn a travs de un catter por va intravenosa (IV) que se inserta en una de las venas del brazo.  Todos los mtodos de induccin, excepto la ruptura de membranas, se realizan en el hospital. La induccin se realizar en el hospital, de modo que usted y el beb puedan ser controlados cuidadosamente.  CUNTO TIEMPO LLEVA INDUCIR EL TRABAJO DE PARTO?  Algunas inducciones pueden demorar entre 2 y 3 das. Generalmente lleva menos   tiempo, dependiendo del estado del cuello del tero. Puede tomar ms tiempo si la induccin se realiza en etapas tempranas del embarazo o es su primer embarazo. Si han pasado 2 o 3 das y no se inicia el trabajo de parto, podrn enviarla a su casa o realizar una cesrea.  CULES SON LOS RIESGOS ASOCIADOS CON LA INDUCCiN DEL TRABAJO DE PARTO?  Algunos de los riesgos de la induccin son:   Cambios en la frecuencia cardaca fetal, por ejemplo los latidos son demasiado rpidos, o lentos, o errticos.   Riesgo de distrs  fetal.   Posibilidad de infeccin en la madre o el beb.   Aumento de la posibilidad de que sea necesaria una cesrea.   Ruptura (abrupcin) de la placenta del tero (raro).   Ruptura uterina (muy raro).  Cuando es necesario realizar la induccin por razones mdicas, los beneficios deben superar a los riesgos.  CULES SON ALGUNAS RAZONES PARA NO INDUCIR EL TRABAJO DE PARTO?  La induccin no debe realizarse si:   Se demuestra que el beb no tolera el trabajo de parto.   Fue sometida anteriormente a cirugas en el tero, como una miomectoma o le han extirpado fibromas.   La placenta est en una posicin muy baja en el tero y obstruye la abertura del cuello (placenta previa).   El beb no est ubicado con la cabeza hacia bajo.   El cordn umbilical cae hacia el canal de parto, adelante del beb. Esto puede cortar el suministro de sangre y oxgeno al beb.   Fue sometida a una cesrea anteriormente.   Hay circunstancias poco habituales, como que el beb es extremadamente prematuro.  Esta informacin no tiene como fin reemplazar el consejo del mdico. Asegrese de hacerle al mdico cualquier pregunta que tenga.  Document Released: 07/31/2007 Document Revised: 08/15/2015 Document Reviewed: 11/20/2012  Elsevier Interactive Patient Education  2017 [employer]vier Inc[employer]

## 2017-05-20 NOTE — Progress Notes (Signed)

## 2017-05-20 NOTE — Progress Notes (Signed)
NST and BPP nl   PRENATAL VISIT NOTE  Subjective:  Nadeen Landauricka Reyes Albarran is a 37 y.o. G7P6006 at 4446w0d being seen today for ongoing prenatal care.  She is currently monitored for the following issues for this high-risk pregnancy and has Depression affecting pregnancy in second trimester, antepartum; Maternal pregestational diabetes classes B through R, antepartum; Supervision of high risk pregnancy, antepartum; Advanced maternal age in multigravida; and Grand multiparity on their problem list.  Patient reports heartburn, nausea and vomiting.  Contractions: Irregular. Vag. Bleeding: None.  Movement: Present. Denies leaking of fluid.   The following portions of the patient's history were reviewed and updated as appropriate: allergies, current medications, past family history, past medical history, past social history, past surgical history and problem list. Problem list updated.  Objective:   Vitals:   05/20/17 0940  BP: 98/68  Pulse: (!) 119  Weight: 110.4 kg (243 lb 6.4 oz)    Fetal Status: Fetal Heart Rate (bpm): NST   Movement: Present     General:  Alert, oriented and cooperative. Patient is in no acute distress.  Skin: Skin is warm and dry. No rash noted.   Cardiovascular: Normal heart rate noted  Respiratory: Normal respiratory effort, no problems with respiration noted  Abdomen: Soft, gravid, appropriate for gestational age.  Pain/Pressure: Present     Pelvic: Cervical exam deferred        Extremities: Normal range of motion.     Mental Status:  Normal mood and affect. Normal behavior. Normal judgment and thought content.   Assessment and Plan:  Pregnancy: G7P6006 at 7146w0d  1. Supervision of high risk pregnancy, antepartum Reflux not well controlled - pantoprazole (PROTONIX) 40 MG tablet; Take 1 tablet (40 mg total) by mouth daily.  Dispense: 20 tablet; Refill: 1  2. Maternal pregestational diabetes classes B through R, antepartum Few low FBS, most PP in range, highest  170  3. Elderly multigravida in third trimester   Term labor symptoms and general obstetric precautions including but not limited to vaginal bleeding, contractions, leaking of fluid and fetal movement were reviewed in detail with the patient. Please refer to After Visit Summary for other counseling recommendations.  Return in about 5 weeks (around 06/24/2017) for PP visit.  IOL 1/21.   Scheryl DarterJames Rhodia [place], MD

## 2017-05-21 ENCOUNTER — Other Ambulatory Visit: Payer: Self-pay | Admitting: Advanced Practice Midwife

## 2017-05-27 ENCOUNTER — Inpatient Hospital Stay (HOSPITAL_COMMUNITY): Payer: Medicaid Other | Admitting: Anesthesiology

## 2017-05-27 ENCOUNTER — Encounter (HOSPITAL_COMMUNITY): Payer: Self-pay

## 2017-05-27 ENCOUNTER — Inpatient Hospital Stay (HOSPITAL_COMMUNITY)
Admission: RE | Admit: 2017-05-27 | Discharge: 2017-05-29 | DRG: 807 | Disposition: A | Discharge status: Home / Self Care | Payer: Medicaid Other | Source: Ambulatory Visit | Attending: Obstetrics and Gynecology | Admitting: Obstetrics and Gynecology

## 2017-05-27 DIAGNOSIS — D649 Anemia, unspecified: Secondary | ICD-10-CM | POA: Diagnosis present

## 2017-05-27 DIAGNOSIS — O9902 Anemia complicating childbirth: Secondary | ICD-10-CM | POA: Diagnosis present

## 2017-05-27 DIAGNOSIS — Z3A39 39 weeks gestation of pregnancy: Secondary | ICD-10-CM

## 2017-05-27 DIAGNOSIS — O99344 Other mental disorders complicating childbirth: Secondary | ICD-10-CM | POA: Diagnosis present

## 2017-05-27 DIAGNOSIS — Z91040 Latex allergy status: Secondary | ICD-10-CM | POA: Diagnosis not present

## 2017-05-27 DIAGNOSIS — F329 Major depressive disorder, single episode, unspecified: Secondary | ICD-10-CM | POA: Diagnosis present

## 2017-05-27 DIAGNOSIS — O24419 Gestational diabetes mellitus in pregnancy, unspecified control: Secondary | ICD-10-CM | POA: Diagnosis present

## 2017-05-27 DIAGNOSIS — Z7982 Long term (current) use of aspirin: Secondary | ICD-10-CM

## 2017-05-27 DIAGNOSIS — O24425 Gestational diabetes mellitus in childbirth, controlled by oral hypoglycemic drugs: Principal | ICD-10-CM | POA: Diagnosis present

## 2017-05-27 DIAGNOSIS — O99214 Obesity complicating childbirth: Secondary | ICD-10-CM | POA: Diagnosis present

## 2017-05-27 LAB — GLUCOSE, CAPILLARY
GLUCOSE-CAPILLARY: 74 mg/dL (ref 65–99)
Glucose-Capillary: 67 mg/dL (ref 65–99)
Glucose-Capillary: 49 mg/dL — ABNORMAL LOW (ref 65–99)
Glucose-Capillary: 62 mg/dL — ABNORMAL LOW (ref 65–99)
Glucose-Capillary: 72 mg/dL (ref 65–99)
Glucose-Capillary: 71 mg/dL (ref 65–99)

## 2017-05-27 LAB — RPR: RPR: NONREACTIVE

## 2017-05-27 LAB — TYPE AND SCREEN
ABO/RH(D): B POS
ANTIBODY SCREEN: NEGATIVE

## 2017-05-27 LAB — CBC
HEMATOCRIT: 37.5 % (ref 36.0–46.0)
Hemoglobin: 12.1 g/dL (ref 12.0–15.0)
MCH: 26.8 pg (ref 26.0–34.0)
MCHC: 32.3 g/dL (ref 30.0–36.0)
MCV: 83 fL (ref 78.0–100.0)
Platelets: 251 10*3/uL (ref 150–400)
RBC: 4.52 MIL/uL (ref 3.87–5.11)
RDW: 17.6 % — AB (ref 11.5–15.5)
WBC: 6.8 10*3/uL (ref 4.0–10.5)

## 2017-05-27 LAB — ABO/RH: ABO/RH(D): B POS

## 2017-05-27 MED ORDER — ONDANSETRON HCL 4 MG/2ML IJ SOLN: 4.0000 mg | Freq: Four times a day (QID) | INTRAMUSCULAR | Status: DC | PRN

## 2017-05-27 MED ORDER — LIDOCAINE HCL (PF) 1 % IJ SOLN
INTRAMUSCULAR | Status: DC | PRN
  Administered 2017-05-27 (×2): 4 mL via EPIDURAL

## 2017-05-27 MED ORDER — SOD CITRATE-CITRIC ACID 500-334 MG/5ML PO SOLN: 30.0000 mL | ORAL | Status: DC | PRN

## 2017-05-27 MED ORDER — PHENYLEPHRINE 40 MCG/ML (10ML) SYRINGE FOR IV PUSH (FOR BLOOD PRESSURE SUPPORT)
80.0000 ug | PREFILLED_SYRINGE | INTRAVENOUS | Status: DC | PRN
  Filled 2017-05-27: qty 5

## 2017-05-27 MED ORDER — ACETAMINOPHEN 325 MG PO TABS
650.0000 mg | ORAL_TABLET | ORAL | Status: DC | PRN
Start: 2017-05-27 — End: 2017-05-28

## 2017-05-27 MED ORDER — LIDOCAINE HCL (PF) 1 % IJ SOLN
30.0000 mL | INTRAMUSCULAR | Status: DC | PRN
  Filled 2017-05-27: qty 30

## 2017-05-27 MED ORDER — EPHEDRINE 5 MG/ML INJ
10.0000 mg | INTRAVENOUS | Status: DC | PRN
  Filled 2017-05-27: qty 2

## 2017-05-27 MED ORDER — LACTATED RINGERS IV SOLN
INTRAVENOUS | Status: DC
  Administered 2017-05-27: 08:00:00 via INTRAVENOUS

## 2017-05-27 MED ORDER — PHENYLEPHRINE 40 MCG/ML (10ML) SYRINGE FOR IV PUSH (FOR BLOOD PRESSURE SUPPORT)
80.0000 ug | PREFILLED_SYRINGE | INTRAVENOUS | Status: DC | PRN
  Filled 2017-05-27: qty 5
  Filled 2017-05-27: qty 10

## 2017-05-27 MED ORDER — FENTANYL CITRATE (PF) 100 MCG/2ML IJ SOLN
100.0000 ug | INTRAMUSCULAR | Status: DC | PRN
  Administered 2017-05-27 (×2): 100 ug via INTRAVENOUS
  Filled 2017-05-27 (×2): qty 2

## 2017-05-27 MED ORDER — TERBUTALINE SULFATE 1 MG/ML IJ SOLN
0.2500 mg | Freq: Once | INTRAMUSCULAR | Status: DC | PRN
  Filled 2017-05-27: qty 1

## 2017-05-27 MED ORDER — OXYTOCIN 40 UNITS IN LACTATED RINGERS INFUSION - SIMPLE MED: 2.5000 [IU]/h | INTRAVENOUS | Status: DC

## 2017-05-27 MED ORDER — DIPHENHYDRAMINE HCL 50 MG/ML IJ SOLN: 12.5000 mg | INTRAMUSCULAR | Status: DC | PRN

## 2017-05-27 MED ORDER — OXYCODONE-ACETAMINOPHEN 5-325 MG PO TABS: 2.0000 | ORAL_TABLET | ORAL | Status: DC | PRN

## 2017-05-27 MED ORDER — FENTANYL 2.5 MCG/ML BUPIVACAINE 1/10 % EPIDURAL INFUSION (WH - ANES)
14.0000 mL/h | INTRAMUSCULAR | Status: DC | PRN
  Administered 2017-05-27: 14 mL/h via EPIDURAL
  Filled 2017-05-27: qty 100

## 2017-05-27 MED ORDER — OXYTOCIN BOLUS FROM INFUSION
500.0000 mL | Freq: Once | INTRAVENOUS | Status: AC
  Administered 2017-05-27: 500 mL via INTRAVENOUS

## 2017-05-27 MED ORDER — LACTATED RINGERS IV SOLN: 500.0000 mL | INTRAVENOUS | Status: DC | PRN

## 2017-05-27 MED ORDER — OXYTOCIN 40 UNITS IN LACTATED RINGERS INFUSION - SIMPLE MED
1.0000 m[IU]/min | INTRAVENOUS | Status: DC
  Administered 2017-05-27: 2 m[IU]/min via INTRAVENOUS
  Filled 2017-05-27: qty 1000

## 2017-05-27 MED ORDER — OXYCODONE-ACETAMINOPHEN 5-325 MG PO TABS: 1.0000 | ORAL_TABLET | ORAL | Status: DC | PRN

## 2017-05-27 MED ORDER — LACTATED RINGERS IV SOLN: 500.0000 mL | Freq: Once | INTRAVENOUS | Status: DC

## 2017-05-27 NOTE — H&P (Signed)
OBSTETRIC ADMISSION HISTORY AND PHYSICAL  Tiffany Lamb is a 37 y.o. female [phone] with IUP at [redacted]w[redacted]d by 20wk U/s presenting for IOL for A2GDM (on glyburide). She reports +FMs, No LOF, no VB, no blurry vision, headaches or peripheral edema, and RUQ pain.  She plans on bottle feeding. She requests more information for birth control. She received her prenatal care at [facility].   Dating: By Milus Banister u/s --->  Estimated Date of Delivery: 06/03/17  Sono:  @[redacted]w[redacted]d , CWD, normal anatomy, 90% EFW   Prenatal History/Complications:  Past Medical History: Past Medical History:  Diagnosis Date  . Anemia   . Depression   . Gestational diabetes     Past Surgical History: Past Surgical History:  Procedure Laterality Date  . gall bladder  2007    Obstetrical History: OB History    Gravida Para Term Preterm AB Living   7 6 6     6    SAB TAB Ectopic Multiple Live Births           6      Social History: Social History   Socioeconomic History  . Marital status: Single    Spouse name: None  . Number of children: None  . Years of education: None  . Highest education level: None  Social Needs  . Financial resource strain: None  . Food insecurity - worry: None  . Food insecurity - inability: None  . Transportation needs - medical: None  . Transportation needs - non-medical: None  Occupational History  . None  Tobacco Use  . Smoking status: Never Smoker  . Smokeless tobacco: Never Used  Substance and Sexual Activity  . Alcohol use: No  . Drug use: No  . Sexual activity: No    Birth control/protection: None  Other Topics Concern  . None  Social History Narrative  . None    Family History: Family History  Problem Relation Age of Onset  . Hepatitis Mother   . Heart disease Brother   . Cancer Paternal Aunt        breast  . Cancer Paternal Grandmother        liver  . Cancer Paternal Grandfather        gastric  . Diabetes Paternal Grandfather     Allergies: Allergies   Allergen Reactions  . Latex Other (See Comments)    "throat starts closing up"    Medications Prior to Admission  Medication Sig Dispense Refill Last Dose  . aspirin EC 81 MG tablet Take 1 tablet (81 mg total) by mouth daily. 100 tablet 1 Taking  . famotidine (PEPCID) 20 MG tablet Take 1 tablet (20 mg total) by mouth 2 (two) times daily. 60 tablet 3 Taking  . Ferrous Sulfate (IRON) 325 (65 Fe) MG TABS Take 1 tablet (325 mg total) by mouth 2 (two) times daily. 30 each 5 Taking  . glyBURIDE (DIABETA) 2.5 MG tablet Take 1 tablet (2.5 mg total) by mouth 2 (two) times daily with a meal. 60 tablet 3 Taking  . pantoprazole (PROTONIX) 40 MG tablet Take 1 tablet (40 mg total) by mouth daily. 20 tablet 1   . Prenatal Multivit-Min-Fe-FA (PRENATAL VITAMINS) 0.8 MG tablet Take 1 tablet by mouth daily. 30 tablet 12 Taking  . promethazine (PHENERGAN) 25 MG tablet Take 0.5 tablets (12.5 mg total) every 6 (six) hours as needed by mouth for nausea or vomiting. (Patient not taking: Reported on 05/02/2017) 30 tablet 0 Not Taking  . sertraline (ZOLOFT) 50 MG  tablet Take 50 mg by mouth.   Not Taking     Review of Systems   All systems reviewed and negative except as stated in HPI  Blood pressure 105/73, pulse (!) 104, temperature 98.5 F (36.9 C), temperature source Oral, resp. rate 16, height 5\' 2"  (1.575 m), weight 110.9 kg (244 lb 9.6 oz), last menstrual period 08/13/2016. General appearance: alert, cooperative, appears stated age, no distress and moderately obese Lungs: clear to auscultation bilaterally Heart: regular rate and rhythm Abdomen: soft, non-tender; bowel sounds normal Extremities: Homans sign is negative, no sign of DVT Presentation: cephalic Fetal monitoringBaseline: 135 bpm, Variability: Good {> 6 bpm), Accelerations: Reactive and Decelerations: Absent (occasional poor quality NST) Uterine activityFrequency: Every 6-8 minutes Dilation: 1 Effacement (%): 20 Station: -3 Exam by:: Lorn Junes, RN   Prenatal labs: ABO, Rh: B/Negative, Positive/-- (07/03 0000) Antibody: Negative (07/03 0000) Rubella: Immune, Immune (07/03 0000) RPR: Non Reactive (09/24 1335)  HBsAg: Negative (07/03 0000)  HIV: Non Reactive (09/24 1335)  GBS: Negative (12/28 0000)  1 hr Glucola GDM Genetic screening  Quad screen= 1:160 for Down Syn, recalculated to be low risk Anatomy US WNL  Prenatal Transfer Tool  Maternal Diabetes: Yes:  Diabetes Type:  Insulin/Medication controlled (glyburide) Genetic Screening: Abnormal:  Results: Elevated risk of Trisomy 21 1:160 Maternal Ultrasounds/Referrals: Normal Fetal Ultrasounds or other Referrals:  Referred to Materal Fetal Medicine  Maternal Substance Abuse:  No Significant Maternal Medications:  Meds include: Zoloft Protonix Other: ASA, Pepcid, Iron, intermittent use of zoloft Significant Maternal Lab Results: Lab values include: Group B Strep negative  Results for orders placed or performed during the hospital encounter of 05/27/17 (from the past 24 hour(s))  CBC   Collection Time: 05/27/17  7:50 AM  Result Value Ref Range   WBC 6.8 4.0 - 10.5 K/uL   RBC 4.52 3.87 - 5.11 MIL/uL   Hemoglobin 12.1 12.0 - 15.0 g/dL   HCT 30.8 65.7 - 84.6 %   MCV 83.0 78.0 - 100.0 fL   MCH 26.8 26.0 - 34.0 pg   MCHC 32.3 30.0 - 36.0 g/dL   RDW 96.2 (H) 95.2 - 84.1 %   Platelets 251 150 - 400 K/uL  Glucose, capillary   Collection Time: 05/27/17  8:41 AM  Result Value Ref Range   Glucose-Capillary 67 65 - 99 mg/dL    Patient Active Problem List   Diagnosis Date Noted  . Gestational diabetes 05/27/2017  . Advanced maternal age in multigravida 02/06/2017  . Grand multiparity 02/06/2017  . Supervision of high risk pregnancy, antepartum 01/09/2017  . Depression affecting pregnancy in second trimester, antepartum 11/26/2016  . Maternal pregestational diabetes classes B through R, antepartum 11/26/2016    Assessment/Plan:  Tiffany Lamb is a 37 y.o.  G7P6006 at [redacted]w[redacted]d here for IOL for A2GDM (on glyburide). C/b AMA, h/o depression.  #Labor: IOL with pitocin gtt (currently at 6), consider FB placement. #Pain: Controlled, may request epidural #FWB: Reactive NST #ID:  GBS neg #MOF: bottle #MOC: undecided #Circ:  No #Depression: low threshold for concern for postpartum mood disorder, not taking zoloft for side effects but reports mood has been good, recommend BH referral postpartum  Alroy Bailiff, MD  05/27/2017, 9:10 AM  I confirm that I have verified the information documented in the resident's note and that I have also personally reperformed the physical exam and all medical decision making activities. The patient was seen and examined by me also Agree with note NST reactive and  reassuring UCs as listed Cervical exams as listed in note  Aviva SignsWilliams, Marie L, CNM

## 2017-05-27 NOTE — Anesthesia Preprocedure Evaluation (Signed)
Anesthesia Evaluation  Patient identified by MRN, date of birth, ID band Patient awake    Reviewed: Allergy & Precautions, H&P , NPO status , Patient's Chart, lab work & pertinent test results  History of Anesthesia Complications Negative for: history of anesthetic complications  Airway Mallampati: II  TM Distance: >3 FB Neck ROM: full    Dental no notable dental hx. (+) Teeth Intact   Pulmonary neg pulmonary ROS,    Pulmonary exam normal breath sounds clear to auscultation       Cardiovascular negative cardio ROS Normal cardiovascular exam Rhythm:regular Rate:Normal     Neuro/Psych PSYCHIATRIC DISORDERS Depression negative neurological ROS     GI/Hepatic negative GI ROS, Neg liver ROS,   Endo/Other  diabetes, Gestational, Oral Hypoglycemic AgentsMorbid obesity  Renal/GU negative Renal ROS  negative genitourinary   Musculoskeletal   Abdominal (+) + obese,   Peds  Hematology negative hematology ROS (+)   Anesthesia Other Findings   Reproductive/Obstetrics (+) Pregnancy                             Anesthesia Physical Anesthesia Plan  ASA: III  Anesthesia Plan: Epidural   Post-op Pain Management:    Induction:   PONV Risk Score and Plan:   Airway Management Planned:   Additional Equipment:   Intra-op Plan:   Post-operative Plan:   Informed Consent: I have reviewed the patients History and Physical, chart, labs and discussed the procedure including the risks, benefits and alternatives for the proposed anesthesia with the patient or authorized representative who has indicated his/her understanding and acceptance.     Plan Discussed with:   Anesthesia Plan Comments:         Anesthesia Quick Evaluation

## 2017-05-27 NOTE — Progress Notes (Signed)
Labor Progress Note Nadeen Landauricka Reyes Albarran is a 37 y.o. [id number] at 4038w0d presented for IOL for A2GDM (on glyburide).   S: Doing well, recently felt water break around 1350.  O:  BP 119/76   Pulse 89   Temp 98.5 F (36.9 C) (Oral)   Resp 16   Ht 5\' 2"  (1.575 m)   Wt 110.9 kg (244 lb 9.6 oz)   LMP 08/13/2016 (LMP Unknown)   BMI 44.74 kg/m  EFM: 150/mod var/+accels, no decels Toco: Ctx Q3-584min  CVE: Dilation: 1 Effacement (%): 20 Station: -3 Presentation: Vertex Exam by:: Lorn Junes. Goodman, RN   A&P: 37 y.o. [id number] 5238w0d IOL for A2GDM (on glyburide).  #Labor: On pit gtt 16, FB placed at 1400. #Pain: controlled #FWB: reactive NST #GBS neg  Alroy BailiffParker W Akeya Ryther, MD 2:12 PM

## 2017-05-27 NOTE — Progress Notes (Addendum)
07:30 spanish interpreter Veryl Speaktta in to help with pt.admission.  [id number]- Spanish interpreter Veryl Speaktta in to help explain POC to pt. (SVE, CBG checks and pitocin)  0935- Etta in to assist resident, Dr. Rushie GoltzLeland speak with pt. And further discuss POC.  1305- Spanish interpreter Etta at bedside to assist RN to explain hypoglycemic episode and POC.  1400- Spanish interpreter Etta at bedside to assist with explanation of SVE and foley bulb placement.

## 2017-05-27 NOTE — Anesthesia Procedure Notes (Signed)
Epidural Patient location during procedure: OB Start time: 05/27/2017 6:20 PM  Staffing Anesthesiologist: Leonides GrillsEllender, Dejuana Weist P, MD Performed: anesthesiologist   Preanesthetic Checklist Completed: patient identified, site marked, pre-op evaluation, timeout performed, IV checked, risks and benefits discussed and monitors and equipment checked  Epidural Patient position: sitting Prep: DuraPrep Patient monitoring: heart rate, cardiac monitor, continuous pulse ox and blood pressure Approach: midline Location: L3-L4 Injection technique: LOR air  Needle:  Needle type: Tuohy  Needle gauge: 17 G Needle length: 9 cm Needle insertion depth: 6 cm Catheter type: closed end flexible Catheter size: 19 Gauge Catheter at skin depth: 11 cm Test dose: negative and Other  Assessment Events: blood not aspirated, injection not painful, no injection resistance and negative IV test  Additional Notes Informed consent obtained prior to proceeding including risk of failure, 1% risk of PDPH, risk of minor discomfort and bruising. Timeout performed pre-procedure verifying patient name, procedure, and platelet count.  Patient tolerated procedure well. Reason for block:procedure for pain

## 2017-05-27 NOTE — Progress Notes (Signed)
Patient ID: Tiffany Lamb, female   DOB: 12/09/1980, 37 y.o.   MRN: [id number] Foley out an hour ago  Vitals:   05/27/17 1310 05/27/17 1410 05/27/17 1438 05/27/17 1602  BP: 119/76 140/68 126/63   Pulse: 89 90 78   Resp: 16 16 16    Temp:    98.8 F (37.1 C)  TempSrc:    Oral  Weight:      Height:       FHR stable UCs every 2 min  Dilation: 6.5 Effacement (%): 50 Cervical Position: Middle Station: -3 Presentation: Vertex Exam by:: Herr rn  Anticipate SVD

## 2017-05-27 NOTE — Anesthesia Pain Management Evaluation Note (Signed)
  CRNA Pain Management Visit Note  Patient: Tiffany Lamb, 37 y.o., female  "Hello I am a member of the anesthesia team at [facility]. We have an anesthesia team available at all times to provide care throughout the hospital, including epidural management and anesthesia for C-section. I don't know your plan for the delivery whether it a natural birth, water birth, IV sedation, nitrous supplementation, doula or epidural, but we want to meet your pain goals."   1.Was your pain managed to your expectations on prior hospitalizations?   Yes   2.What is your expectation for pain management during this hospitalization?     IV pain meds  3.How can we help you reach that goal? IV pain meds  Record the patient's initial score and the patient's pain goal.   Pain: 0/10  Pain Goal: 0/10 [facility] wants you to be able to say your pain was always managed very well.  Tiffany ArntSterling, Kou Gucciardo Marie 05/27/2017

## 2017-05-28 MED ORDER — ONDANSETRON HCL 4 MG/2ML IJ SOLN: 4.0000 mg | INTRAMUSCULAR | Status: DC | PRN

## 2017-05-28 MED ORDER — ONDANSETRON HCL 4 MG PO TABS: 4.0000 mg | ORAL_TABLET | ORAL | Status: DC | PRN

## 2017-05-28 MED ORDER — TETANUS-DIPHTH-ACELL PERTUSSIS 5-2.5-18.5 LF-MCG/0.5 IM SUSP: 0.5000 mL | Freq: Once | INTRAMUSCULAR | Status: DC

## 2017-05-28 MED ORDER — COCONUT OIL OIL
1.0000 "application " | TOPICAL_OIL | Status: DC | PRN
  Filled 2017-05-28: qty 120

## 2017-05-28 MED ORDER — SERTRALINE HCL 50 MG PO TABS
50.0000 mg | ORAL_TABLET | Freq: Every day | ORAL | Status: DC
  Administered 2017-05-28: 50 mg via ORAL
  Filled 2017-05-28 (×3): qty 1

## 2017-05-28 MED ORDER — FERROUS SULFATE 325 (65 FE) MG PO TABS
325.0000 mg | ORAL_TABLET | Freq: Two times a day (BID) | ORAL | Status: DC
  Administered 2017-05-28 – 2017-05-29 (×2): 325 mg via ORAL
  Filled 2017-05-28 (×2): qty 1

## 2017-05-28 MED ORDER — ACETAMINOPHEN 325 MG PO TABS: 650.0000 mg | ORAL_TABLET | ORAL | Status: DC | PRN

## 2017-05-28 MED ORDER — DIBUCAINE 1 % RE OINT: 1.0000 "application " | TOPICAL_OINTMENT | RECTAL | Status: DC | PRN

## 2017-05-28 MED ORDER — FAMOTIDINE 20 MG PO TABS
20.0000 mg | ORAL_TABLET | Freq: Two times a day (BID) | ORAL | Status: DC
  Administered 2017-05-28 (×2): 20 mg via ORAL
  Filled 2017-05-28 (×3): qty 1

## 2017-05-28 MED ORDER — PRENATAL VITAMINS 0.8 MG PO TABS: 1.0000 | ORAL_TABLET | Freq: Every day | ORAL | Status: DC

## 2017-05-28 MED ORDER — IBUPROFEN 600 MG PO TABS
600.0000 mg | ORAL_TABLET | Freq: Four times a day (QID) | ORAL | Status: DC
  Administered 2017-05-28 – 2017-05-29 (×6): 600 mg via ORAL
  Filled 2017-05-28 (×6): qty 1

## 2017-05-28 MED ORDER — WITCH HAZEL-GLYCERIN EX PADS: 1.0000 "application " | MEDICATED_PAD | CUTANEOUS | Status: DC | PRN

## 2017-05-28 MED ORDER — BENZOCAINE-MENTHOL 20-0.5 % EX AERO
1.0000 "application " | INHALATION_SPRAY | CUTANEOUS | Status: DC | PRN
  Administered 2017-05-29: 1 via TOPICAL
  Filled 2017-05-28 (×2): qty 56

## 2017-05-28 MED ORDER — DIPHENHYDRAMINE HCL 25 MG PO CAPS: 25.0000 mg | ORAL_CAPSULE | Freq: Four times a day (QID) | ORAL | Status: DC | PRN

## 2017-05-28 MED ORDER — ZOLPIDEM TARTRATE 5 MG PO TABS: 5.0000 mg | ORAL_TABLET | Freq: Every evening | ORAL | Status: DC | PRN

## 2017-05-28 MED ORDER — SIMETHICONE 80 MG PO CHEW: 80.0000 mg | CHEWABLE_TABLET | ORAL | Status: DC | PRN

## 2017-05-28 MED ORDER — PRENATAL MULTIVITAMIN CH
1.0000 | ORAL_TABLET | Freq: Every day | ORAL | Status: DC
  Administered 2017-05-28 – 2017-05-29 (×2): 1 via ORAL
  Filled 2017-05-28 (×2): qty 1

## 2017-05-28 MED ORDER — SENNOSIDES-DOCUSATE SODIUM 8.6-50 MG PO TABS
2.0000 | ORAL_TABLET | ORAL | Status: DC
  Administered 2017-05-28 – 2017-05-29 (×2): 2 via ORAL
  Filled 2017-05-28 (×2): qty 2

## 2017-05-28 NOTE — Lactation Note (Signed)
This note was copied from a baby's chart. Lactation Consultation Note  Patient Name: Tiffany Nadeen Landauricka Reyes Albarran UJWJX'BToday's Date: 05/28/2017 Reason for consult: Initial assessment This is mom's 7th baby.  She breastfed 4 of her previous babies.  The last baby was breastfed for 8 months.  Mom states baby is latching well.  Instructed to feed with cues and to call for assist/concerns prn.  Maternal Data Does the patient have breastfeeding experience prior to this delivery?: Yes  Feeding Feeding Type: Breast Fed  LATCH Score Latch: Grasps breast easily, tongue down, lips flanged, rhythmical sucking.  Audible Swallowing: A few with stimulation  Type of Nipple: Everted at rest and after stimulation  Comfort (Breast/Nipple): Soft / non-tender  Hold (Positioning): No assistance needed to correctly position infant at breast.  LATCH Score: 9  Interventions    Lactation Tools Discussed/Used     Consult Status Consult Status: PRN    [name], Abdulhadi Stopa S 05/28/2017, 10:34 AM

## 2017-05-28 NOTE — Progress Notes (Signed)
Patient's daughter is ordering her meals. Eda H Royal Interpreter.

## 2017-05-28 NOTE — Anesthesia Postprocedure Evaluation (Signed)
Anesthesia Post Note  Patient: Tiffany Lamb  Procedure(s) Performed: AN AD HOC LABOR EPIDURAL     Patient location during evaluation: Mother Baby Anesthesia Type: Epidural Level of consciousness: awake and alert Pain management: pain level controlled Vital Signs Assessment: post-procedure vital signs reviewed and stable Respiratory status: spontaneous breathing and nonlabored ventilation Cardiovascular status: stable Postop Assessment: no headache, no backache, epidural receding, no apparent nausea or vomiting, patient able to bend at knees and adequate PO intake Anesthetic complications: no    Last Vitals:  Vitals:   05/28/17 0000 05/28/17 0500  BP: 101/65 (!) 92/55  Pulse: 90 79  Resp: 18   Temp: 37.4 C 37.2 C    Last Pain:  Vitals:   05/28/17 0500  TempSrc: Oral  PainSc:    Pain Goal:                 Land O'LakesMalinova,Dondi Aime Hristova

## 2017-05-28 NOTE — Progress Notes (Signed)
Tiffany Lamb was referred for history of depression/anxiety. * Referral screened out by Clinical Social Worker because none of the following criteria appear to apply: ~ History of anxiety/depression during this pregnancy, or of post-partum depression. ~ Diagnosis of anxiety and/or depression within last 3 years OR * Tiffany Lamb's symptoms currently being treated with medication and/or therapy; Tiffany Lamb currently taking Zoloft.   Please contact the Clinical Social Worker if needs arise, by Tiffany Lamb request, or if Tiffany Lamb scores greater than 9/yes to question 10 on Edinburgh Postpartum Depression Screen.  Elinora Weigand Boyd-Gilyard, MSW, LCSW Clinical Social Work [phone]    

## 2017-05-28 NOTE — Progress Notes (Addendum)
To do the patient's admission, the RN went in and used interpreter [phone] through [employer] on speaker phone since the patient was spanish speaking. Discussed in this admission was going over mom's assessment, giving her her scheduled medications, as well as going through her admission paperwork. Mom did not have any further questions and understood the teaching. Mom was also taught how to use the bulb syringe and use the call bell when needed.

## 2017-05-28 NOTE — Progress Notes (Signed)
Parent request formula to supplement breast feeding due to plan on using both  Parents have been informed of small tummy size of newborn, taught hand expression and understands the possible consequences of formula to the health of the infant. The possible consequences shared with patient include 1) Loss of confidence in breastfeeding 2) Engorgement 3) Allergic sensitization of baby(asthma/allergies) and 4) decreased milk supply for mother.After discussion of the above the mother decided to breastfeed then use formula. .The  tool used to give formula supplement will be nipple per mom.Discussed using interpreter. Mom states this is her seventh child and she used both with all children.

## 2017-05-28 NOTE — Progress Notes (Signed)
Post Partum Day 1 Subjective: no complaints, up ad lib and tolerating PO  Objective: Blood pressure (!) 92/55, pulse 79, temperature 99 F (37.2 C), temperature source Oral, resp. rate 18, height 5\' 2"  (1.575 m), weight 110.9 kg (244 lb 9.6 oz), last menstrual period 08/13/2016, unknown if currently breastfeeding.  Physical Exam:  General: alert, cooperative and no distress Lochia: appropriate Uterine Fundus: firm DVT Evaluation: No evidence of DVT seen on physical exam. No significant calf/ankle edema. No cords. Slight tenderness to calves bilaterally   Recent Labs    05/27/17 0750  HGB 12.1  HCT 37.5    Assessment/Plan: Plan for discharge tomorrow, Breastfeeding and Contraception NFP   LOS: 1 day   Tiffany Lamb 05/28/2017, 7:31 AM

## 2017-05-29 MED ORDER — SENNOSIDES-DOCUSATE SODIUM 8.6-50 MG PO TABS: 2.0000 | ORAL_TABLET | ORAL | 0 refills | Status: DC

## 2017-05-29 MED ORDER — IBUPROFEN 600 MG PO TABS: 600.0000 mg | ORAL_TABLET | Freq: Four times a day (QID) | ORAL | 0 refills | Status: DC

## 2017-05-29 NOTE — Discharge Summary (Signed)
OB Discharge Summary     Patient Name: Tiffany Lamb DOB: 1981-04-03 MRN: [id number]  Date of admission: 05/27/2017 Delivering MD: Alroy BailiffLELAND, PARKER W   Date of discharge: 05/29/2017  Admitting diagnosis: INDUCTION Intrauterine pregnancy: [id number]     Secondary diagnosis:  Active Problems:   Gestational diabetes  Additional problems: depression     Discharge diagnosis: Term Pregnancy Delivered, GDM A2 and Anemia                                                                                                Post partum procedures:none  Augmentation: Pitocin and Foley Balloon  Complications: None  Hospital course:  Induction of Labor With Vaginal Delivery   37 y.o. yo [id number] at 3965w0d was admitted to the hospital 05/27/2017 for induction of labor.  Indication for induction: A2 DM.  Patient had an uncomplicated labor course as follows: Membrane Rupture Time/Date: 1:55 PM ,05/27/2017   Intrapartum Procedures: Episiotomy: None [1]                                         Lacerations:  1st degree [2];Perineal [11]  Patient had delivery of a Viable infant.  Information for the patient's newborn:  Tiffany Lamb, Boy Tina [[id number]][[id number]]  Delivery Method: Vaginal, Spontaneous(Filed from Delivery Summary)   05/27/2017  Details of delivery can be found in separate delivery note.  Patient had a routine postpartum course. CBGs were well controlled postpartum without medication. Patient is discharged home 05/29/17.  Physical exam  Vitals:   05/28/17 0500 05/28/17 1215 05/28/17 1824 05/29/17 0511  BP: (!) 92/55 (!) 98/52 127/65 (!) 100/55  Pulse: 79 74 88 67  Resp:  18 18 16   Temp: 99 F (37.2 C) 98.6 F (37 C) 98.5 F (36.9 C) 98 F (36.7 C)  TempSrc: Oral Oral Oral Oral  Weight:    108.8 kg (239 lb 12.8 oz)  Height:       General: alert, cooperative and no distress Lochia: appropriate Uterine Fundus: firm Incision: N/A DVT Evaluation: No evidence of DVT seen on physical  exam. No cords or calf tenderness. LE edema present equal bilaterally   Labs: Lab Results  Component Value Date   WBC 6.8 05/27/2017   HGB 12.1 05/27/2017   HCT 37.5 05/27/2017   MCV 83.0 05/27/2017   PLT 251 05/27/2017   CMP Latest Ref Rng & Units 01/09/2017  Glucose 65 - 99 mg/dL 74  BUN 6 - 20 mg/dL 6  Creatinine 1.300.57 - 8.651.00 mg/dL 7.84(O0.45(L)  Sodium 962134 - 952144 mmol/L 138  Potassium 3.5 - 5.2 mmol/L 4.5  Chloride 96 - 106 mmol/L 104  CO2 20 - 29 mmol/L 20  Calcium 8.7 - 10.2 mg/dL 9.0  Total Protein 6.0 - 8.5 g/dL 6.8  Total Bilirubin 0.0 - 1.2 mg/dL <8.4<0.2  Alkaline Phos 39 - 117 IU/L 78  AST 0 - 40 IU/L 15  ALT 0 - 32 IU/L 9    Discharge instruction: per After Visit Summary  and "Baby and Me Booklet".  After visit meds:  Allergies as of 05/29/2017      Reactions   Latex Other (See Comments)   "throat starts closing up"      Medication List    STOP taking these medications   aspirin EC 81 MG tablet   glyBURIDE 2.5 MG tablet Commonly known as:  DIABETA   pantoprazole 40 MG tablet Commonly known as:  PROTONIX   promethazine 25 MG tablet Commonly known as:  PHENERGAN     TAKE these medications   famotidine 20 MG tablet Commonly known as:  PEPCID Take 1 tablet (20 mg total) by mouth 2 (two) times daily.   ibuprofen 600 MG tablet Commonly known as:  ADVIL,MOTRIN Take 1 tablet (600 mg total) by mouth every 6 (six) hours.   Iron 325 (65 Fe) MG Tabs Take 1 tablet (325 mg total) by mouth 2 (two) times daily.   Prenatal Vitamins 0.8 MG tablet Take 1 tablet by mouth daily.   sertraline 50 MG tablet Commonly known as:  ZOLOFT Take 50 mg by mouth daily.       Diet: carb modified diet  Activity: Advance as tolerated. Pelvic rest for 6 weeks.   Outpatient follow up:4 weeks. Will need to be fasting Follow up Appt: Future Appointments  Date Time Provider Department Center  06/05/2017  1:45 PM Claiborne Rigg, NP [facility] None   Follow up Visit:No Follow-up  on file. Follow-up Information    [facility]             . Schedule an appointment as soon as possible for a visit in 4 week(s).   Why:  Follow up in 4 weeks for post partum check. Please be fasting as you will need a glucola.  Contact information: [place]          Postpartum contraception: Natural Family Planning  Newborn Data: Live born female  Birth Weight: 8 lb 12.7 oz (3989 g) APGAR: 8, 9  Newborn Delivery   Birth date/time:  05/27/2017 20:19:00 Delivery type:  Vaginal, Spontaneous     Baby Feeding: Breast Disposition:home with mother   05/29/2017 Tiffany Manis, DO  OB FELLOW DISCHARGE ATTESTATION  I have seen and examined this patient. I agree with above documentation and have made edits as needed.   Tiffany Ada, DO

## 2017-05-29 NOTE — Discharge Instructions (Signed)
Please fast before your 4 week post partum check as you will need a repeat glucola test  Home Care Instructions for Mom ACTIVITY  Gradually return to your regular activities.  Let yourself rest. Nap while your baby sleeps.  Avoid lifting anything that is heavier than 10 lb (4.5 kg) until your health care provider says it is okay.  Avoid activities that take a lot of effort and energy (are strenuous) until approved by your health care provider. Walking at a slow-to-moderate pace is usually safe.  If you had a cesarean delivery: ? Do not vacuum, climb stairs, or drive a car for 4-6 weeks. ? Have someone help you at home until you feel like you can do your usual activities yourself. ? Do exercises as told by your health care provider, if this applies.  VAGINAL BLEEDING You may continue to bleed for 4-6 weeks after delivery. Over time, the amount of blood usually decreases and the color of the blood usually gets lighter. However, the flow of bright red blood may increase if you have been too active. If you need to use more than one pad in an hour because your pad gets soaked, or if you pass a large clot:  Lie down.  Raise your feet.  Place a cold compress on your lower abdomen.  Rest.  Call your health care provider.  If you are breastfeeding, your period should return anytime between 8 weeks after delivery and the time that you stop breastfeeding. If you are not breastfeeding, your period should return 6-8 weeks after delivery. PERINEAL CARE The perineal area, or perineum, is the part of your body between your thighs. After delivery, this area needs special care. Follow these instructions as told by your health care provider.  Take warm tub baths for 15-20 minutes.  Use medicated pads and pain-relieving sprays and creams as told.  Do not use tampons or douches until vaginal bleeding has stopped.  Each time you go to the bathroom: ? Use a peri bottle. ? Change your pad. ? Use  towelettes in place of toilet paper until your stitches have healed.  Do Kegel exercises every day. Kegel exercises help to maintain the muscles that support the vagina, bladder, and bowels. You can do these exercises while you are standing, sitting, or lying down. To do Kegel exercises: ? Tighten the muscles of your abdomen and the muscles that surround your birth canal. ? Hold for a few seconds. ? Relax. ? Repeat until you have done this 5 times in a row.  To prevent hemorrhoids from developing or getting worse: ? Drink enough fluid to keep your urine clear or pale yellow. ? Avoid straining when having a bowel movement. ? Take over-the-counter medicines and stool softeners as told by your health care provider.  BREAST CARE  Wear a tight-fitting bra.  Avoid taking over-the-counter pain medicine for breast discomfort.  Apply ice to the breasts to help with discomfort as needed: ? Put ice in a plastic bag. ? Place a towel between your skin and the bag. ? Leave the ice on for 20 minutes or as told by your health care provider.  NUTRITION  Eat a well-balanced diet.  Do not try to lose weight quickly by cutting back on calories.  Take your prenatal vitamins until your postpartum checkup or until your health care provider tells you to stop.  POSTPARTUM DEPRESSION You may find yourself crying for no apparent reason and unable to cope with all of the changes  that come with having a newborn. This mood is called postpartum depression. Postpartum depression happens because your hormone levels change after delivery. If you have postpartum depression, get support from your partner, friends, and family. If the depression does not go away on its own after several weeks, contact your health care provider. BREAST SELF-EXAM Do a breast self-exam each month, at the same time of the month. If you are breastfeeding, check your breasts just after a feeding, when your breasts are less full. If you are  breastfeeding and your period has started, check your breasts on day 5, 6, or 7 of your period. Report any lumps, bumps, or discharge to your health care provider. Know that breasts are normally lumpy if you are breastfeeding. This is temporary, and it is not a health risk. INTIMACY AND SEXUALITY Avoid sexual activity for at least 3-4 weeks after delivery or until the brownish-red vaginal flow is completely gone. If you want to avoid pregnancy, use some form of birth control. You can get pregnant after delivery, even if you have not had your period. SEEK MEDICAL CARE IF:  You feel unable to cope with the changes that a child brings to your life, and these feelings do not go away after several weeks.  You notice a lump, a bump, or discharge on your breast.  SEEK IMMEDIATE MEDICAL CARE IF:  Blood soaks your pad in 1 hour or less.  You have: ? Severe pain or cramping in your lower abdomen. ? A bad-smelling vaginal discharge. ? A fever that is not controlled by medicine. ? A fever, and an area of your breast is red and sore. ? Pain or redness in your calf. ? Sudden, severe chest pain. ? Shortness of breath. ? Painful or bloody urination. ? Problems with your vision.  You vomit for 12 hours or longer.  You develop a severe headache.  You have serious thoughts about hurting yourself, your child, or anyone else.  This information is not intended to replace advice given to you by your health care provider. Make sure you discuss any questions you have with your health care provider. Document Released: 04/20/2000 Document Revised: 09/29/2015 Document Reviewed: 10/25/2014 Elsevier Interactive Patient Education  2017 [employer]

## 2017-06-05 ENCOUNTER — Encounter: Payer: Self-pay | Admitting: Nurse Practitioner

## 2017-06-05 ENCOUNTER — Ambulatory Visit: Payer: Self-pay | Attending: Nurse Practitioner | Admitting: Nurse Practitioner

## 2017-06-05 DIAGNOSIS — O24439 Gestational diabetes mellitus in the puerperium, unspecified control: Secondary | ICD-10-CM | POA: Insufficient documentation

## 2017-06-05 DIAGNOSIS — O99342 Other mental disorders complicating pregnancy, second trimester: Secondary | ICD-10-CM

## 2017-06-05 DIAGNOSIS — Z79899 Other long term (current) drug therapy: Secondary | ICD-10-CM | POA: Insufficient documentation

## 2017-06-05 DIAGNOSIS — R519 Headache, unspecified: Secondary | ICD-10-CM

## 2017-06-05 DIAGNOSIS — F329 Major depressive disorder, single episode, unspecified: Secondary | ICD-10-CM | POA: Insufficient documentation

## 2017-06-05 DIAGNOSIS — D649 Anemia, unspecified: Secondary | ICD-10-CM | POA: Insufficient documentation

## 2017-06-05 DIAGNOSIS — R600 Localized edema: Secondary | ICD-10-CM | POA: Insufficient documentation

## 2017-06-05 DIAGNOSIS — F32A Depression, unspecified: Secondary | ICD-10-CM

## 2017-06-05 DIAGNOSIS — R51 Headache: Secondary | ICD-10-CM | POA: Insufficient documentation

## 2017-06-05 MED ORDER — IBUPROFEN 800 MG PO TABS: 800.0000 mg | ORAL_TABLET | Freq: Four times a day (QID) | ORAL | 1 refills | Status: DC

## 2017-06-05 NOTE — Patient Instructions (Addendum)
Edema Edema is when you have too much fluid in your body or under your skin. Edema may make your legs, feet, and ankles swell up. Swelling is also common in looser tissues, like around your eyes. This is a common condition. It gets more common as you get older. There are many possible causes of edema. Eating too much salt (sodium) and being on your feet or sitting for a long time can cause edema in your legs, feet, and ankles. Hot weather may make edema worse. Edema is usually painless. Your skin may look swollen or shiny. Follow these instructions at home:  Keep the swollen body part raised (elevated) above the level of your heart when you are sitting or lying down.  Do not sit still or stand for a long time.  Do not wear tight clothes. Do not wear garters on your upper legs.  Exercise your legs. This can help the swelling go down.  Wear elastic bandages or support stockings as told by your doctor.  Eat a low-salt (low-sodium) diet to reduce fluid as told by your doctor.  Depending on the cause of your swelling, you may need to limit how much fluid you drink (fluid restriction).  Take over-the-counter and prescription medicines only as told by your doctor. Contact a doctor if:  Treatment is not working.  You have heart, liver, or kidney disease and have symptoms of edema.  You have sudden and unexplained weight gain. Get help right away if:  You have shortness of breath or chest pain.  You cannot breathe when you lie down.  You have pain, redness, or warmth in the swollen areas.  You have heart, liver, or kidney disease and get edema all of a sudden.  You have a fever and your symptoms get worse all of a sudden. Summary  Edema is when you have too much fluid in your body or under your skin.  Edema may make your legs, feet, and ankles swell up. Swelling is also common in looser tissues, like around your eyes.  Raise (elevate) the swollen body part above the level of your  heart when you are sitting or lying down.  Follow your doctor's instructions about diet and how much fluid you can drink (fluid restriction). This information is not intended to replace advice given to you by your health care provider. Make sure you discuss any questions you have with your health care provider. Document Released: 10/10/2007 Document Revised: 05/11/2016 Document Reviewed: 05/11/2016 Elsevier Interactive Patient Education  2017 [employer]vier Inc.  
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
## 2017-06-06 ENCOUNTER — Encounter: Payer: Self-pay | Admitting: Nurse Practitioner

## 2017-06-06 LAB — CMP14+EGFR
ALBUMIN: 3.6 g/dL (ref 3.5–5.5)
ALT: 26 IU/L (ref 0–32)
AST: 21 IU/L (ref 0–40)
Albumin/Globulin Ratio: 1.2 (ref 1.2–2.2)
Alkaline Phosphatase: 133 IU/L — ABNORMAL HIGH (ref 39–117)
BUN / CREAT RATIO: 20 (ref 9–23)
BUN: 10 mg/dL (ref 6–20)
Bilirubin Total: 0.3 mg/dL (ref 0.0–1.2)
CALCIUM: 9 mg/dL (ref 8.7–10.2)
CO2: 24 mmol/L (ref 20–29)
Chloride: 106 mmol/L (ref 96–106)
Creatinine, Ser: 0.49 mg/dL — ABNORMAL LOW (ref 0.57–1.00)
GFR, EST AFRICAN AMERICAN: 144 mL/min/{1.73_m2} (ref >59)
GFR, EST NON AFRICAN AMERICAN: 125 mL/min/{1.73_m2} (ref >59)
GLOBULIN, TOTAL: 3 g/dL (ref 1.5–4.5)
Glucose: 81 mg/dL (ref 65–99)
POTASSIUM: 4.2 mmol/L (ref 3.5–5.2)
SODIUM: 143 mmol/L (ref 134–144)
TOTAL PROTEIN: 6.6 g/dL (ref 6.0–8.5)

## 2017-06-14 ENCOUNTER — Telehealth: Payer: Self-pay | Admitting: Licensed Clinical Social Worker

## 2017-06-14 NOTE — Telephone Encounter (Signed)
[facility] utilized [employer] [phone]#[id number] to contact pt for behavioral health follow up.   There was no answer or voicemail set-up to leave a message for a return call.

## 2018-04-22 ENCOUNTER — Encounter: Payer: Self-pay | Admitting: *Deleted

## 2018-05-12 IMAGING — US US MFM OB FOLLOW-UP
1 series · 13 of 28 positions shown · non-contrast
Comparison: none

[Series 1: us mfm ob follow-up · 45 acquisitions, 13 frames shown]
[im 2/45]
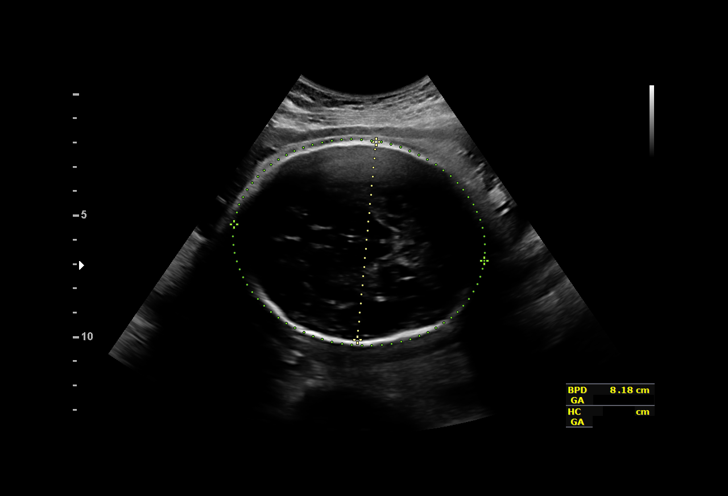
[im 5/45]
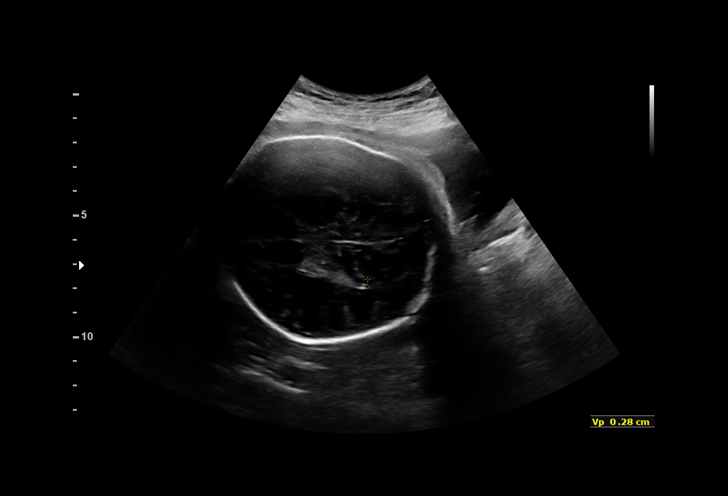
[im 9/45]
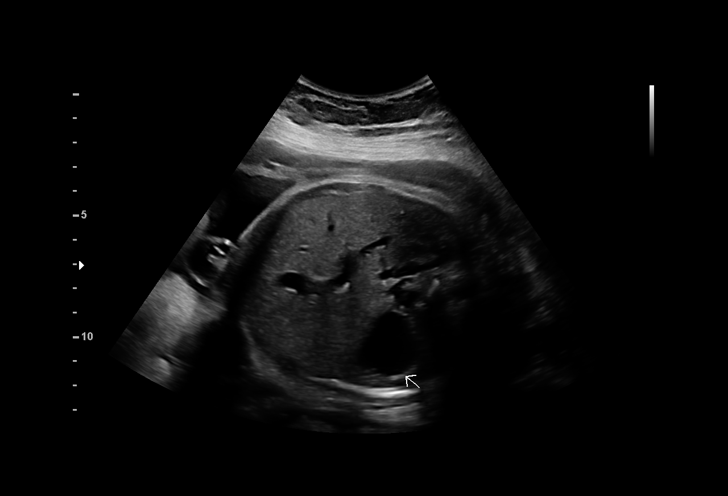
[im 12/45]
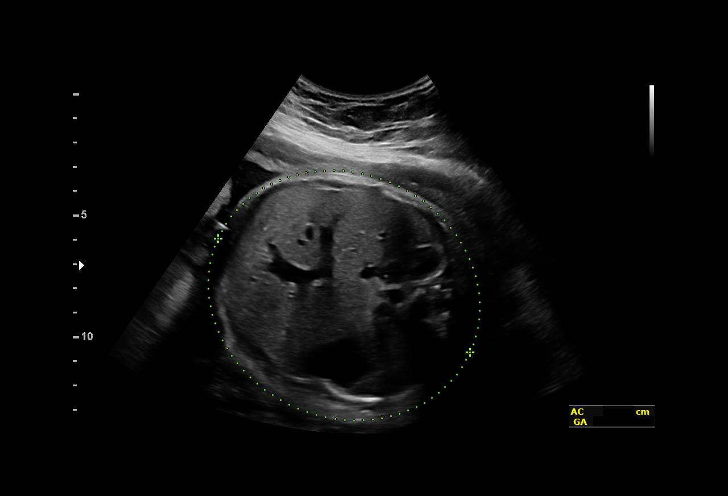
[im 15/45]
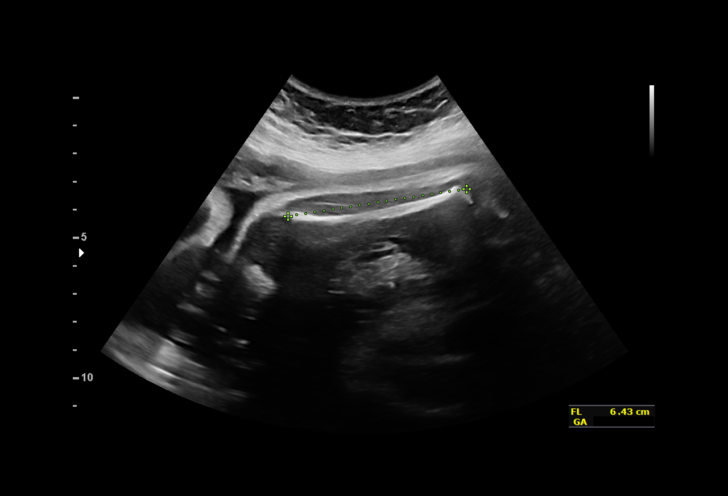
[im 18/45]
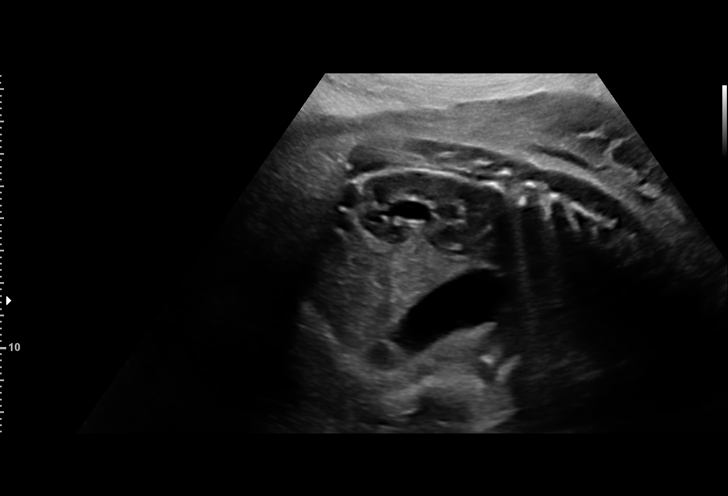
[im 23/45]
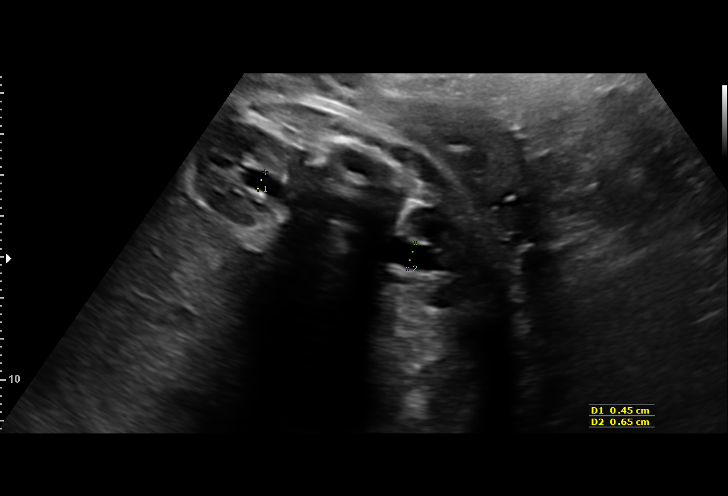
[im 27/45]
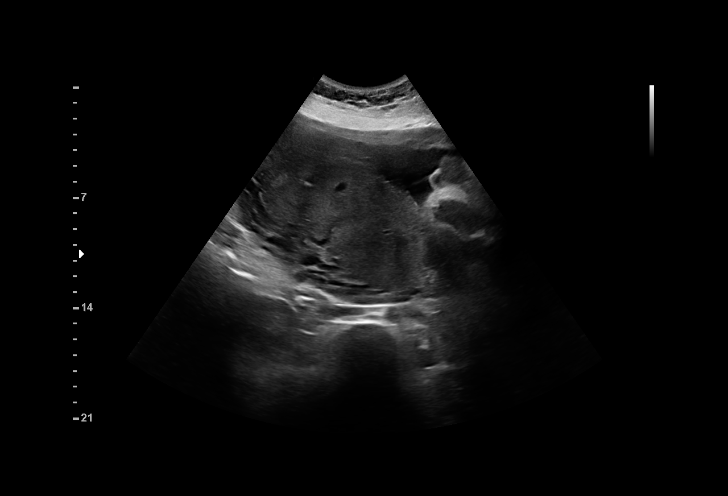
[im 30/45]
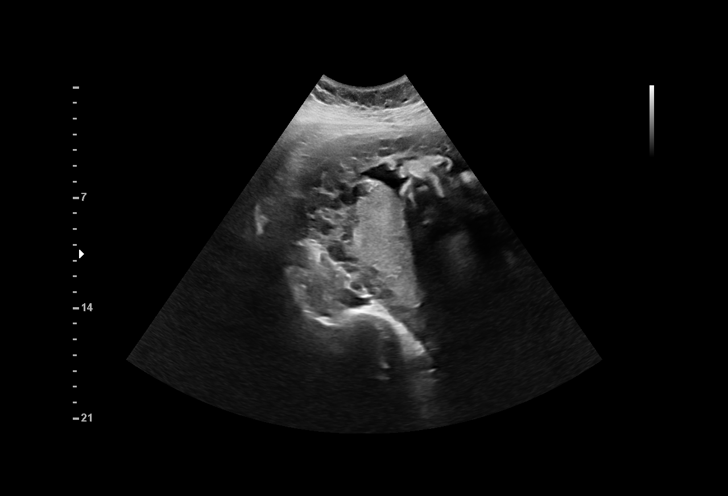
[im 33/45]
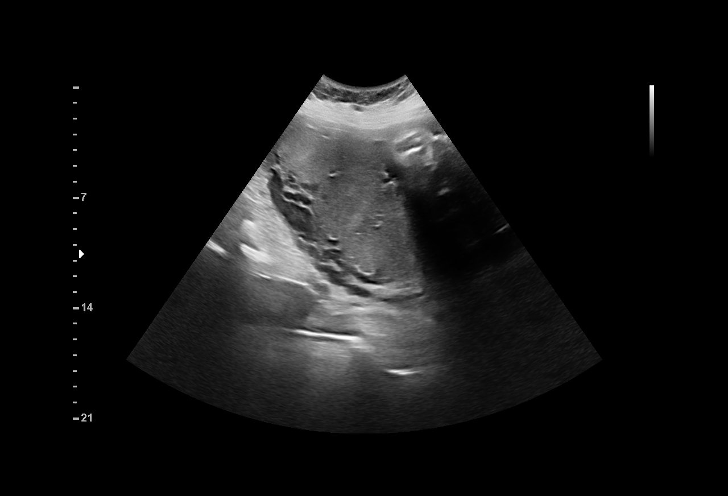
[im 36/45]
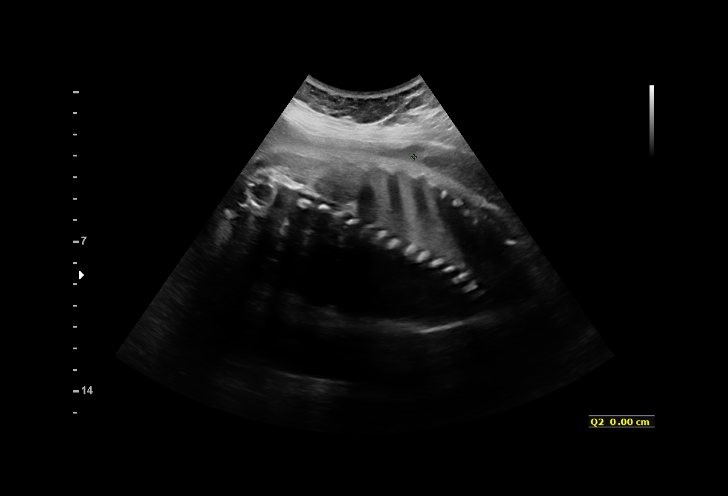
[im 40/45]
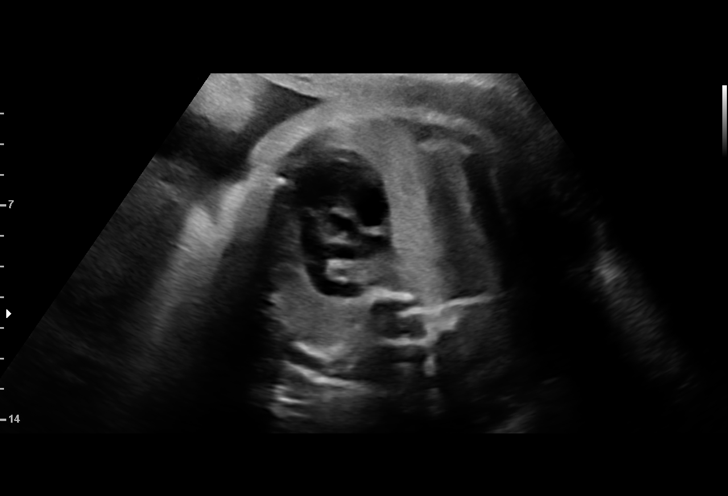
[im 43/45]
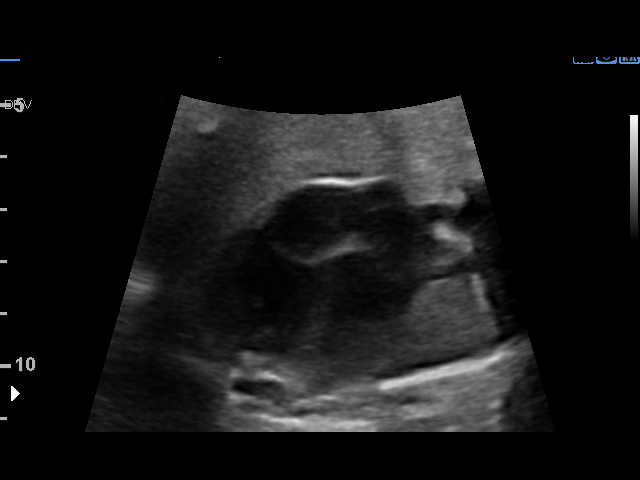

[13 of 28 positions shown; findings below may reference images not displayed]

[REDACTED]

1  GWENDOLYN              [id number]      [id number]     [id number]
Indications

33 weeks gestation of pregnancy
Abnormal biochemical screen (quad) for
Trisomy 21 (DSR [DATE]; recal low risk)
Previous pregnacy with congenital heart
(cardiac) defect: son with hole in heart
Diabetes - Pregestational,3rd trimester
(diagnosed prior to 86wks); glyburide
Advanced maternal age multigravida 35+,
third trimester; low risk quad screen
Obesity complicating pregnancy, third
trimester
Encounter for other antenatal screening
follow-up
OB History

Blood Type:            Height:  5'2"   Weight (lb):  222       BMI:
Gravidity:    7         Term:   6
Living:       6
Fetal Evaluation

Num Of Fetuses:     1
Fetal Heart         159
Rate(bpm):
Cardiac Activity:   Observed
Presentation:       Cephalic
Placenta:           Posterior, above cervical os
P. Cord Insertion:  Previously Visualized
Amniotic Fluid
AFI FV:      Subjectively within normal limits

AFI Sum(cm)     %Tile       Largest Pocket(cm)
8.79            9

RUQ(cm)       RLQ(cm)       LUQ(cm)        LLQ(cm)
2.68          2.39          0
Biometry

BPD:      81.3  mm     G. Age:  32w 5d         21  %    CI:        73.37   %    70 - 86
FL/HC:      21.4   %    19.4 -
HC:      301.6  mm     G. Age:  33w 3d         15  %    HC/AC:      0.90        0.96 -
AC:      336.7  mm     G. Age:  37w 4d       > 97  %    FL/BPD:     79.5   %    71 - 87
FL:       64.6  mm     G. Age:  33w 2d         33  %    FL/AC:      19.2   %    20 - 24
HUM:      57.1  mm     G. Age:  33w 1d         50  %

Est. FW:    1191  gm    5 lb 15 oz      86  %
Gestational Age

LMP:           35w 4d        Date:  08/13/16                 EDD:   05/20/17
U/S Today:     34w 2d                                        EDD:   05/29/17
Best:          33w 4d     Det. By:  U/S  (01/22/17)          EDD:   06/03/17
Anatomy

Cranium:               Appears normal         Aortic Arch:            Previously seen
Cavum:                 Appears normal         Ductal Arch:            Previously seen
Ventricles:            Appears normal         Diaphragm:              Previously seen
Choroid Plexus:        Previously seen        Stomach:                Appears normal, left
sided
Cerebellum:            Previously seen        Abdomen:                Previously seen
Posterior Fossa:       Previously seen        Abdominal Wall:         Previously seen
Nuchal Fold:           Not applicable (>20    Cord Vessels:           Previously seen
wks GA)
Face:                  Orbits and profile     Kidneys:                Appear normal
previously seen
Lips:                  Previously seen        Bladder:                Appears normal
Thoracic:              Appears normal         Spine:                  Previously seen
Heart:                 Appears normal         Upper Extremities:      Previously seen
(4CH, axis, and situs
RVOT:                  Appears normal         Lower Extremities:      Previously seen
LVOT:                  Previously seen

Other:  Fetus appears to be a male prev seen. Heels previously visualized.
Technically difficult due to maternal habitus and fetal position.
Cervix Uterus Adnexa

Cervix
Not visualized (advanced GA >06wks)
Impression

Singleton intrauterine pregnancy at 33+4 weeks with AMA
and diabetes
Interval review of the anatomy shows no sonographic
markers for aneuploidy or structural anomalies
Amniotic fluid volume is normal with an AFI of 9 cm
Estimated fetal weight is 2702g which is growth in the 86th
percentile
Recommendations

Recommend follow-up ultrasound examination in 4 weeks
due to diabetes. Needs antepartum testing from now until
delivery

## 2018-05-21 ENCOUNTER — Emergency Department (HOSPITAL_COMMUNITY)
Admission: EM | Admit: 2018-05-21 | Discharge: 2018-05-21 | Disposition: A | Discharge status: Home / Self Care | Payer: Self-pay | Attending: Emergency Medicine | Admitting: Emergency Medicine

## 2018-05-21 ENCOUNTER — Encounter (HOSPITAL_COMMUNITY): Payer: Self-pay

## 2018-05-21 DIAGNOSIS — B349 Viral infection, unspecified: Secondary | ICD-10-CM | POA: Insufficient documentation

## 2018-05-21 DIAGNOSIS — Z79899 Other long term (current) drug therapy: Secondary | ICD-10-CM | POA: Insufficient documentation

## 2018-05-21 MED ORDER — OSELTAMIVIR PHOSPHATE 75 MG PO CAPS: 75.0000 mg | ORAL_CAPSULE | Freq: Two times a day (BID) | ORAL | 0 refills | Status: AC

## 2018-05-21 MED ORDER — ACETAMINOPHEN 325 MG PO TABS
650.0000 mg | ORAL_TABLET | Freq: Once | ORAL | Status: AC
  Administered 2018-05-21: 650 mg via ORAL
  Filled 2018-05-21: qty 2

## 2018-05-21 MED ORDER — ONDANSETRON 4 MG PO TBDP
4.0000 mg | ORAL_TABLET | Freq: Once | ORAL | Status: AC
  Administered 2018-05-21: 4 mg via ORAL
  Filled 2018-05-21: qty 1

## 2018-05-21 MED ORDER — ONDANSETRON HCL 4 MG PO TABS: 4.0000 mg | ORAL_TABLET | Freq: Four times a day (QID) | ORAL | 0 refills | Status: DC

## 2018-05-21 MED ORDER — IBUPROFEN 400 MG PO TABS
600.0000 mg | ORAL_TABLET | Freq: Once | ORAL | Status: AC
  Administered 2018-05-21: 600 mg via ORAL
  Filled 2018-05-21: qty 1

## 2018-05-21 NOTE — ED Triage Notes (Signed)
Pt endorses since Sunday, she has had chills, vomiting, headache, and decreased appetite, and generalized pain. Pt also endorses ear pain. Pt reports syncopal episode last night. Pt reports waking up on floor, for unknown time. Pt reports diarrhea all last night and today.

## 2018-05-21 NOTE — ED Provider Notes (Signed)
[facility] Provider Note   CSN: [id number] Arrival date & time: 05/21/18  1539     History   Chief Complaint No chief complaint on file.   HPI Tiffany Lamb is a 38 y.o. female.  38 year old female with prior medical history as detailed below presents for evaluation of viral syndrome.  Patient with multiple sick contacts at home with similar symptoms.  Sick contacts have reportedly been diagnosed with influenza.  Patient reports 3 to 4 days of myalgias, subjective fever, congestion, and lightheadedness.  Patient reports decreased p.o. intake because of her symptoms.  Patient reports a near syncopal episode last night where she felt very faint.  She did not fully lose consciousness.  She denies chest pain now or recently.  She denies associated shortness of breath.  Patient denies taking any medications at home for her symptoms.  The history is provided by the patient and medical records. A language interpreter was used.  Illness  Location:  Myalgia, subjective fever, congestion, lightheadedness Severity:  Mild Onset quality:  Gradual Duration:  3 days Timing:  Constant Progression:  Waxing and waning Chronicity:  New Associated symptoms: no fatigue, no fever and no shortness of breath     Past Medical History:  Diagnosis Date  . Anemia   . Depression   . Gestational diabetes     Patient Active Problem List   Diagnosis Date Noted  . Gestational diabetes 05/27/2017  . Advanced maternal age in multigravida 02/06/2017  . Grand multiparity 02/06/2017  . Supervision of high risk pregnancy, antepartum 01/09/2017  . Depression affecting pregnancy in second trimester, antepartum 11/26/2016  . Maternal pregestational diabetes classes B through R, antepartum 11/26/2016    Past Surgical History:  Procedure Laterality Date  . gall bladder  2007     OB History    Gravida  7   Para  7   Term  7   Preterm      AB      Living  7     SAB      TAB      Ectopic      Multiple  0   Live Births  7            Home Medications    Prior to Admission medications   Medication Sig Start Date End Date Taking? Authorizing Provider  famotidine (PEPCID) 20 MG tablet Take 1 tablet (20 mg total) by mouth 2 (two) times daily. Patient not taking: Reported on 06/05/2017 05/02/17   Hermina StaggersErvin, Michael L, MD  Ferrous Sulfate (IRON) 325 (65 Fe) MG TABS Take 1 tablet (325 mg total) by mouth 2 (two) times daily. 05/02/17   Hermina StaggersErvin, Michael L, MD  ibuprofen (ADVIL,MOTRIN) 800 MG tablet Take 1 tablet (800 mg total) by mouth every 6 (six) hours. 06/05/17   Claiborne RiggFleming, Zelda W, NP  ondansetron (ZOFRAN) 4 MG tablet Take 1 tablet (4 mg total) by mouth every 6 (six) hours. 05/21/18   Wynetta FinesMessick, Laryn Venning C, MD  oseltamivir (TAMIFLU) 75 MG capsule Take 1 capsule (75 mg total) by mouth every 12 (twelve) hours for 5 days. 05/21/18 05/26/18  Wynetta FinesMessick, Nadir Vasques C, MD  Prenatal Multivit-Min-Fe-FA (PRENATAL VITAMINS) 0.8 MG tablet Take 1 tablet by mouth daily. 05/02/17   Hermina StaggersErvin, Michael L, MD  senna-docusate (SENOKOT-S) 8.6-50 MG tablet Take 2 tablets by mouth daily. Patient not taking: Reported on 06/05/2017 05/30/17   Alroy BailiffLeland, Parker W, MD  sertraline (ZOLOFT) 50 MG tablet Take  50 mg by mouth daily.     [provider]    Family History Family History  Problem Relation Age of Onset  . Hepatitis Mother   . Heart disease Brother   . Cancer Paternal Aunt        breast  . Cancer Paternal Grandmother        liver  . Cancer Paternal Grandfather        gastric  . Diabetes Paternal Grandfather     Social History Social History   Tobacco Use  . Smoking status: Never Smoker  . Smokeless tobacco: Never Used  Substance Use Topics  . Alcohol use: No  . Drug use: No     Allergies   Latex   Review of Systems Review of Systems  Constitutional: Negative for fatigue and fever.  Respiratory: Negative for shortness of breath.   All other  systems reviewed and are negative.    Physical Exam Updated Vital Signs BP 112/78 (BP Location: Right Arm)   Pulse 77   Temp 99.5 F (37.5 C) (Oral)   Resp 18   Ht 5\' 3"  (1.6 m)   SpO2 99%   BMI 41.45 kg/m   Physical Exam Vitals signs and nursing note reviewed.  Constitutional:      General: She is not in acute distress.    Appearance: Normal appearance. She is well-developed.  HENT:     Head: Normocephalic and atraumatic.  Eyes:     Conjunctiva/sclera: Conjunctivae normal.     Pupils: Pupils are equal, round, and reactive to light.  Neck:     Musculoskeletal: Normal range of motion and neck supple.  Cardiovascular:     Rate and Rhythm: Normal rate and regular rhythm.     Heart sounds: Normal heart sounds.  Pulmonary:     Effort: Pulmonary effort is normal. No respiratory distress.     Breath sounds: Normal breath sounds.  Abdominal:     General: There is no distension.     Palpations: Abdomen is soft.     Tenderness: There is no abdominal tenderness.  Musculoskeletal: Normal range of motion.        General: No deformity.  Skin:    General: Skin is warm and dry.  Neurological:     General: No focal deficit present.     Mental Status: She is alert and oriented to person, place, and time.      ED Treatments / Results  Labs (all labs ordered are listed, but only abnormal results are displayed) Labs Reviewed - No data to display  EKG EKG Interpretation  Date/Time:  Wednesday May 21 2018 17:23:05 EST Ventricular Rate:  87 PR Interval:  120 QRS Duration: 96 QT Interval:  362 QTC Calculation: 435 R Axis:   -40 Text Interpretation:  Normal sinus rhythm Left axis deviation Nonspecific ST abnormality Abnormal ECG Confirmed by Kristine RoyalMessick, Billiejean Schimek [phone]([id number]) on 05/21/2018 5:37:45 PM   Radiology No results found.  Procedures Procedures (including critical care time)  Medications Ordered in ED Medications  ondansetron (ZOFRAN-ODT) disintegrating tablet 4 mg (4  mg Oral Given 05/21/18 1750)  acetaminophen (TYLENOL) tablet 650 mg (650 mg Oral Given 05/21/18 1753)  ibuprofen (ADVIL,MOTRIN) tablet 600 mg (600 mg Oral Given 05/21/18 1753)     Initial Impression / Assessment and Plan / ED Course  I have reviewed the triage vital signs and the nursing notes.  Pertinent labs & imaging results that were available during my care of the patient were reviewed by  me and considered in my medical decision making (see chart for details).     MDM  Screen complete  Patient is presented with a host of complaints most consistent with likely viral syndrome.  Patient did have multiple sick contacts in her home with a friend reportedly diagnosed with flu.  Patient's described symptoms are consistent with likely influenza.  She appears to be nontoxic.  Symptomatic treatment is stressed.  She does report mild intermittent nausea.  She will prescribe Zofran for home use.  Importance of pushing plenty of fluids at home was stressed.  Strict return precautions given and understood.  Importance of close follow-up was stressed.   Final Clinical Impressions(s) / ED Diagnoses   Final diagnoses:  Viral syndrome    ED Discharge Orders         Ordered    ondansetron (ZOFRAN) 4 MG tablet  Every 6 hours     05/21/18 1822    oseltamivir (TAMIFLU) 75 MG capsule  Every 12 hours     05/21/18 1822           Wynetta Fines, MD 05/21/18 1831

## 2018-05-21 NOTE — Discharge Instructions (Signed)
Please return for any problem.  Follow-up with your regular care provider as instructed.  Take Zofran as prescribed for nausea.  Use ibuprofen and acetaminophen for symptomatic relief.  Take Tamiflu as prescribed for possible flu infection.

## 2018-05-21 NOTE — ED Notes (Signed)
Patient verbalizes understanding of discharge instructions. Opportunity for questioning and answers were provided. Armband removed by staff, pt discharged from ED. Pt ambulatory to lobby.  

## 2018-05-21 NOTE — ED Notes (Signed)
ED Provider at bedside. 

## 2018-06-12 IMAGING — US US FETAL BPP W/ NON-STRESS
1 series · 10 of 10 positions shown · non-contrast
Comparison: none

[Series 1: us fetal bpp w/nonstress · 10 acquisitions, 10 frames shown]
[im 1/10]
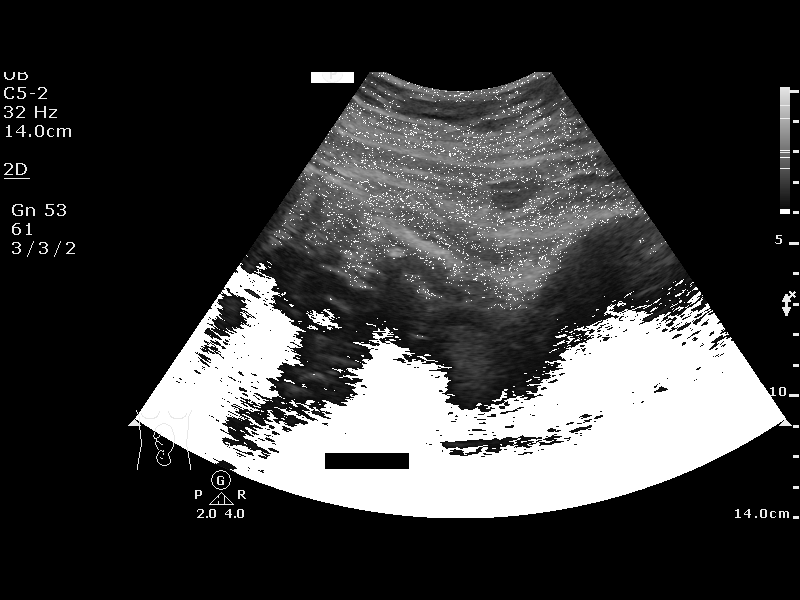
[im 2/10]
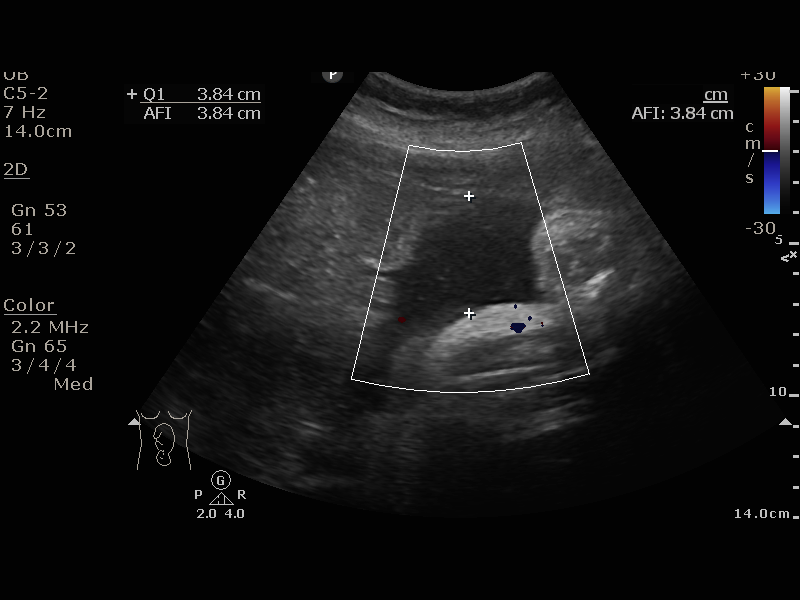
[im 3/10]
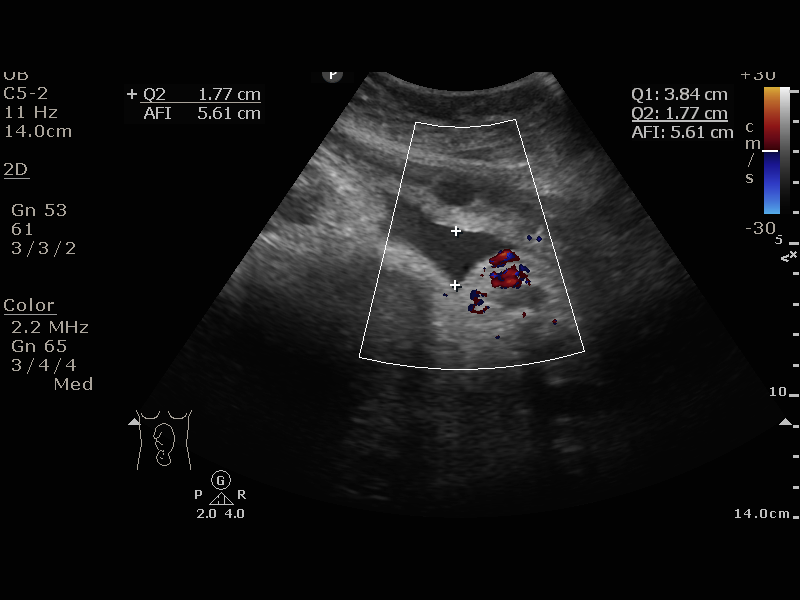
[im 4/10]
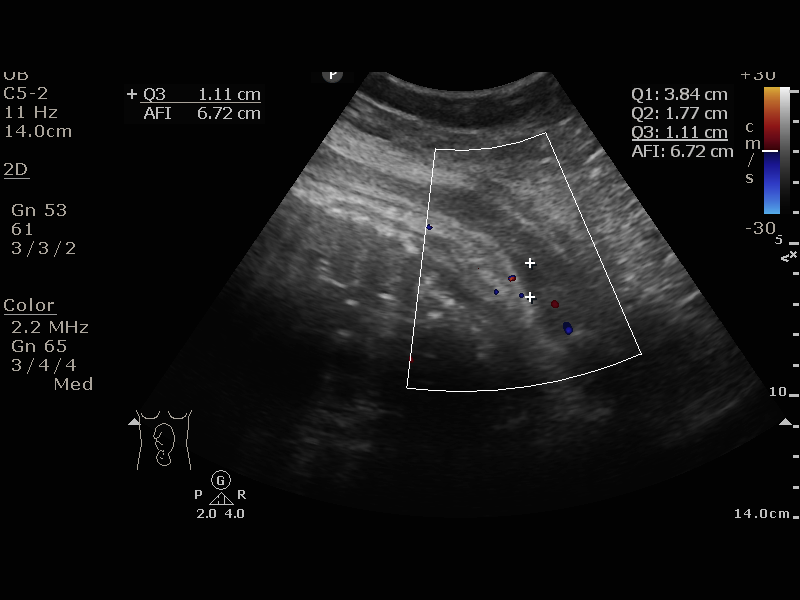
[im 5/10]
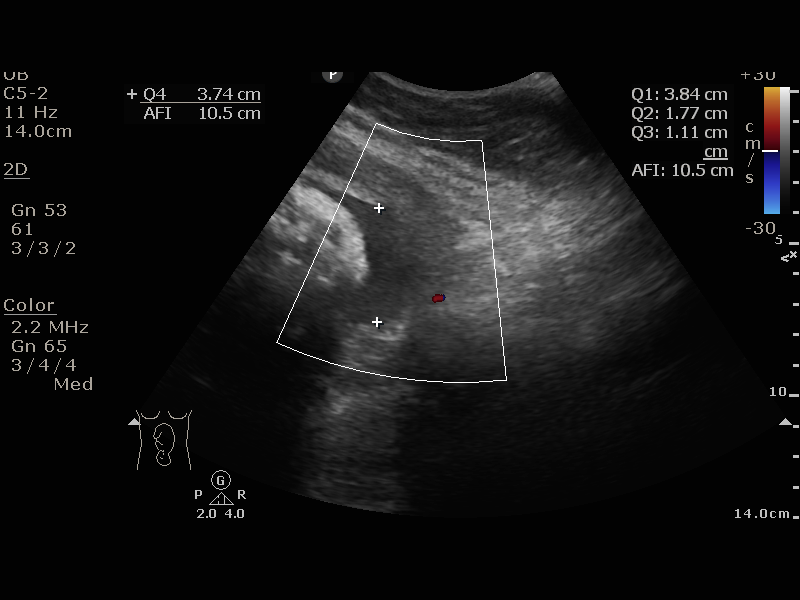
[im 6/10]
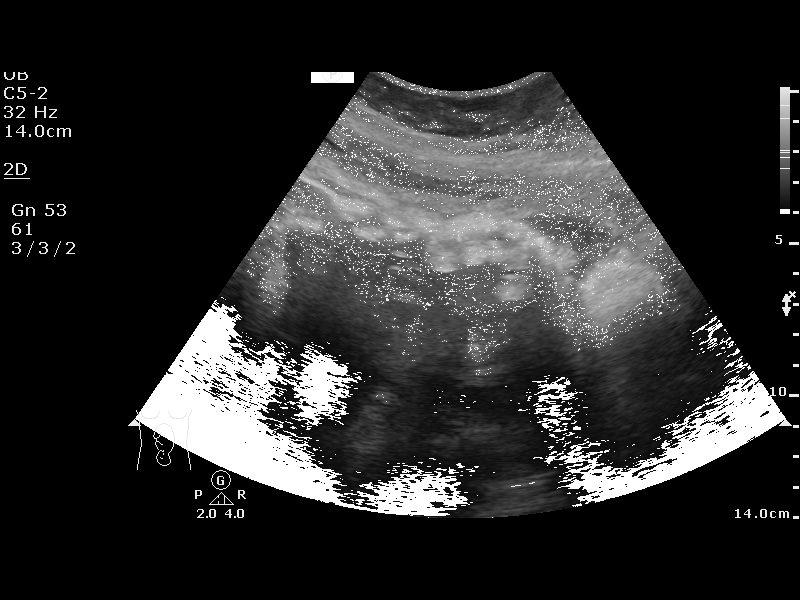
[im 7/10]
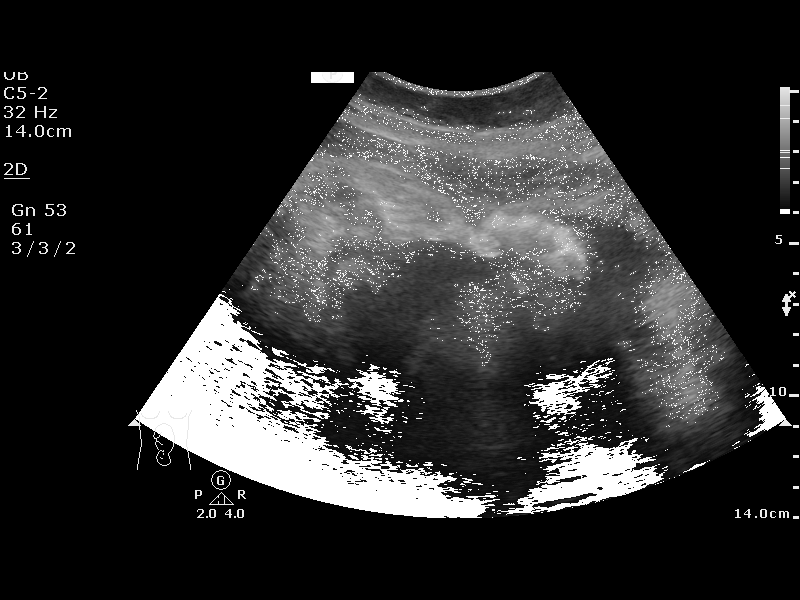
[im 8/10]
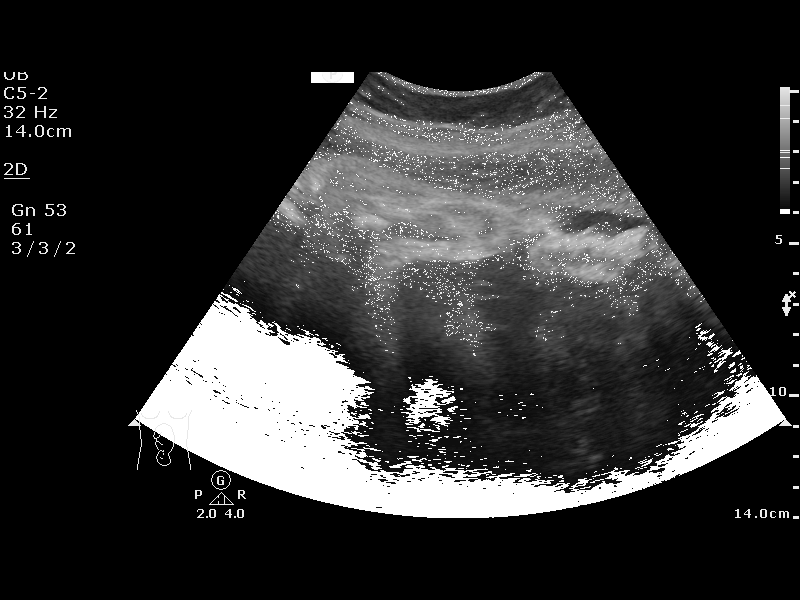
[im 9/10]
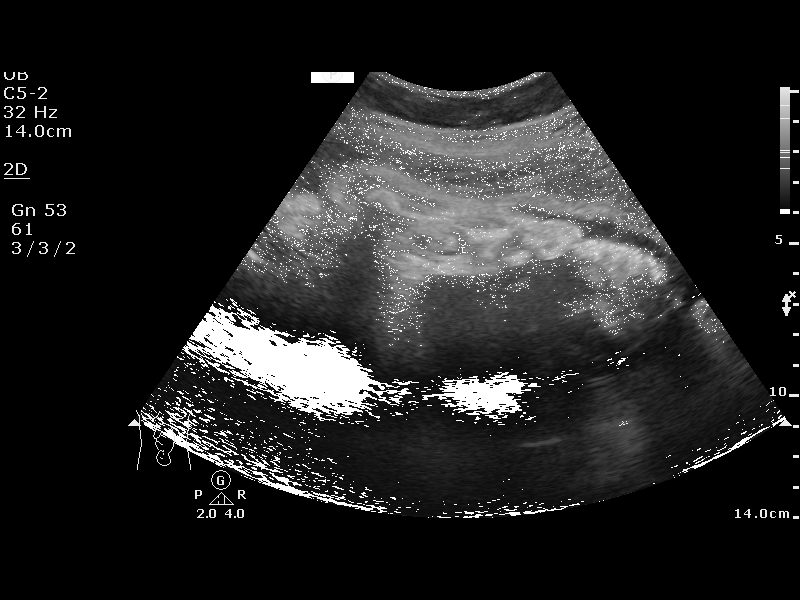
[im 10/10]
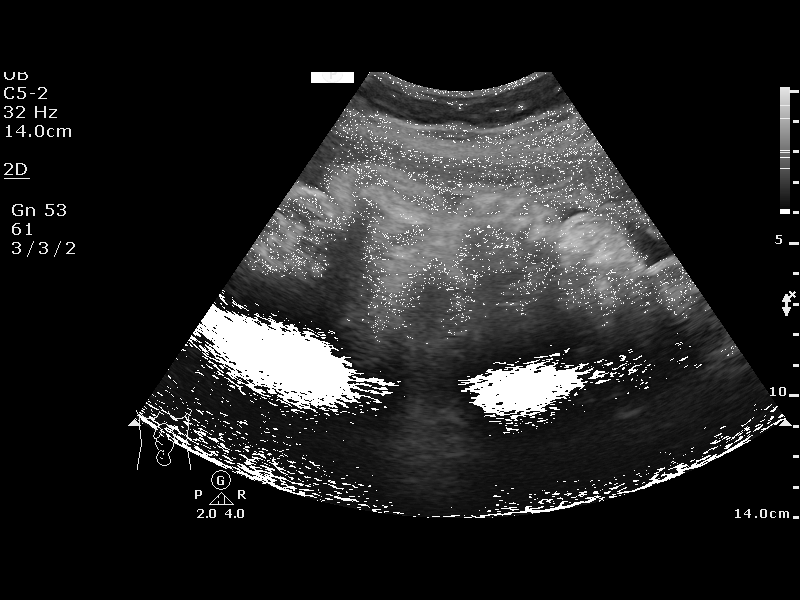

[10 of 10 positions shown; findings below may reference images not displayed]

[REDACTED]
Women's
[REDACTED]

1  US FETAL BPP W/NONSTRESS                    76818.4

1  ASLOMA             [id number]      [id number]     [id number]
Indications

38 weeks gestation of pregnancy
Diabetes - Pregestational,3rd trimester
OB History

Blood Type:            Height:  5'2"   Weight (lb):  222       BMI:
Gravidity:    7         Term:   6
Living:       6
Fetal Evaluation

Num Of Fetuses:     1
Preg. Location:     Intrauterine
Cardiac Activity:   Observed
Presentation:       Cephalic

Amniotic Fluid
AFI FV:      Subjectively within normal limits

AFI Sum(cm)     %Tile       Largest Pocket(cm)
10.46           29

RUQ(cm)       RLQ(cm)       LUQ(cm)        LLQ(cm)
3.84
Biophysical Evaluation

Amniotic F.V:   Pocket => 2 cm two         F. Tone:        Observed
planes
F. Movement:    Observed                   N.S.T:          Reactive
F. Breathing:   Not Observed               Score:          [DATE]
Gestational Age

LMP:           40w 0d        Date:  08/13/16                 EDD:   05/20/17
Best:          38w 0d     Det. By:  U/S  (01/22/17)          EDD:   06/03/17
Impression

Intrauterine pregnancy at 24weeks with
Normal amniotic fluid
BPP [DATE], NST reactive
Recommendations

Continue antenatal testing.
Recommend delivery by 39 weeks if maternal and fetal status
remain reassuring.

## 2018-10-07 ENCOUNTER — Ambulatory Visit: Payer: Self-pay | Attending: Family Medicine | Admitting: Nurse Practitioner

## 2018-10-07 ENCOUNTER — Other Ambulatory Visit: Payer: Self-pay

## 2018-10-07 ENCOUNTER — Encounter: Payer: Self-pay | Admitting: Nurse Practitioner

## 2018-10-07 DIAGNOSIS — R0989 Other specified symptoms and signs involving the circulatory and respiratory systems: Secondary | ICD-10-CM

## 2018-10-07 MED ORDER — FLUTICASONE PROPIONATE 50 MCG/ACT NA SUSP: 2.0000 | Freq: Every day | NASAL | 6 refills | Status: DC

## 2018-10-07 MED ORDER — CETIRIZINE HCL 10 MG PO TABS: 10.0000 mg | ORAL_TABLET | Freq: Every day | ORAL | 11 refills | Status: DC

## 2018-10-07 MED ORDER — BENZONATATE 200 MG PO CAPS: 200.0000 mg | ORAL_CAPSULE | Freq: Three times a day (TID) | ORAL | 0 refills | Status: AC | PRN

## 2018-10-07 NOTE — Progress Notes (Signed)
Virtual Visit via Telephone Note Due to national recommendations of social distancing due to COVID 19, telehealth visit is felt to be most appropriate for this patient at this time.  I discussed the limitations, risks, security and privacy concerns of performing an evaluation and management service by telephone and the availability of in person appointments. I also discussed with the patient that there may be a patient responsible charge related to this service. The patient expressed understanding and agreed to proceed.    I connected with Nadeen Landau on 10/07/18  at  10:50 AM EDT  EDT by telephone and verified that I am speaking with the correct person using two identifiers.   Consent I discussed the limitations, risks, security and privacy concerns of performing an evaluation and management service by telephone and the availability of in person appointments. I also discussed with the patient that there may be a patient responsible charge related to this service. The patient expressed understanding and agreed to proceed.   Location of Patient: Psychologist, clinical of Provider: [facility]    Persons participating in Telemedicine visit: Bertram Denver FNP-BC [facility] CMA Jamesetta Orleans Leo Grosser  [place] [id number]   History of Present Illness: Telemedicine visit for: Establish care  Upper Respiratory Infection: Patient complains of symptoms of a URI. Symptoms include congestion and cough. Onset of symptoms was 5 days ago, gradually worsening since that time. She also c/o headache described as sharp and aching for the past 3 days .  She is drinking plenty of fluids. Evaluation to date: none. Treatment to date: OTC NYQUIL with no relief of symptoms.   Onset 5 days ago. Cough, headaches, runny nose worsening over the past 2 days. She denies fever but does not have a temperature.  Also denies shortness of breath or body aches. She is taking Nyquil which does not  relieve her symptoms. Denies any sick contacts or coworkers.     Past Medical History:  Diagnosis Date  . Anemia   . Depression   . Diabetes mellitus without complication [facility])     Past Surgical History:  Procedure Laterality Date  . gall bladder  2007    Family History  Problem Relation Age of Onset  . Hepatitis Mother   . Heart disease Brother   . Cancer Paternal Aunt        breast  . Cancer Paternal Grandmother        liver  . Cancer Paternal Grandfather        gastric  . Diabetes Paternal Grandfather     Social History   Socioeconomic History  . Marital status: Single    Spouse name: Not on file  . Number of children: Not on file  . Years of education: Not on file  . Highest education level: Not on file  Occupational History  . Not on file  Social Needs  . Financial resource strain: Not on file  . Food insecurity:    Worry: Not on file    Inability: Not on file  . Transportation needs:    Medical: Not on file    Non-medical: Not on file  Tobacco Use  . Smoking status: Never Smoker  . Smokeless tobacco: Never Used  Substance and Sexual Activity  . Alcohol use: No  . Drug use: No  . Sexual activity: Never    Birth control/protection: None  Lifestyle  . Physical activity:    Days per week: Not on file  Minutes per session: Not on file  . Stress: Not on file  Relationships  . Social connections:    Talks on phone: Not on file    Gets together: Not on file    Attends religious service: Not on file    Active member of club or organization: Not on file    Attends meetings of clubs or organizations: Not on file    Relationship status: Not on file  Other Topics Concern  . Not on file  Social History Narrative  . Not on file     Observations/Objective: Awake, alert and oriented x 3   Review of Systems  Constitutional: Negative for fever, malaise/fatigue and weight loss.  HENT: Positive for congestion. Negative for nosebleeds.        Rhinorrhea   Eyes: Negative.  Negative for blurred vision, double vision and photophobia.  Respiratory: Positive for cough. Negative for shortness of breath (denies).   Cardiovascular: Negative.  Negative for chest pain, palpitations and leg swelling.  Gastrointestinal: Negative.  Negative for heartburn, nausea and vomiting.  Musculoskeletal: Negative.  Negative for myalgias.  Neurological: Positive for headaches. Negative for dizziness, focal weakness and seizures.  Endo/Heme/Allergies: Positive for environmental allergies.  Psychiatric/Behavioral: Negative.  Negative for suicidal ideas.    Assessment and Plan: Jamesetta Orleansricka was evlauted today for establish care.  Diagnoses and all orders for this visit:  Upper respiratory symptom -     benzonatate (TESSALON) 200 MG capsule; Take 1 capsule (200 mg total) by mouth 3 (three) times daily as needed for up to 14 days for cough. -     fluticasone (FLONASE) 50 MCG/ACT nasal spray; Place 2 sprays into both nostrils daily. -     cetirizine (ZYRTEC) 10 MG tablet; Take 1 tablet (10 mg total) by mouth daily.  Morbid obesity (HCC) Discussed diet and exercise for person with BMI >25. Instructed: You must burn more calories than you eat. Losing 5 percent of your body weight should be considered a success. In the longer term, losing more than 15 percent of your body weight and staying at this weight is an extremely good result. However, keep in mind that even losing 5 percent of your body weight leads to important health benefits, so try not to get discouraged if you're not able to lose more than this. Will recheck weight in 3-6 months.     Follow Up Instructions Return in about 1 week (around 10/14/2018).     I discussed the assessment and treatment plan with the patient. The patient was provided an opportunity to ask questions and all were answered. The patient agreed with the plan and demonstrated an understanding of the instructions.   The patient was advised to  call back or seek an in-person evaluation if the symptoms worsen or if the condition fails to improve as anticipated.  I provided 19 minutes of non-face-to-face time during this encounter including median intraservice time, reviewing previous notes, labs, imaging, medications and explaining diagnosis and management.  Claiborne RiggZelda W Ambera Fedele, FNP-BC

## 2018-10-08 ENCOUNTER — Ambulatory Visit: Payer: Self-pay | Admitting: Family Medicine

## 2018-10-15 ENCOUNTER — Ambulatory Visit: Payer: Self-pay | Attending: Nurse Practitioner | Admitting: Nurse Practitioner

## 2018-10-15 ENCOUNTER — Other Ambulatory Visit: Payer: Self-pay

## 2018-10-16 ENCOUNTER — Telehealth: Payer: Self-pay | Admitting: Nurse Practitioner

## 2018-10-16 NOTE — Telephone Encounter (Signed)
Will route to PCP 

## 2018-10-16 NOTE — Telephone Encounter (Signed)
Patient called stating she has a cough and the medication she was given for her is not working  Please follow up.

## 2018-10-17 ENCOUNTER — Other Ambulatory Visit: Payer: Self-pay | Admitting: Nurse Practitioner

## 2018-10-17 ENCOUNTER — Other Ambulatory Visit: Payer: Self-pay

## 2018-10-17 DIAGNOSIS — O26893 Other specified pregnancy related conditions, third trimester: Secondary | ICD-10-CM

## 2018-10-17 MED ORDER — OMEPRAZOLE 20 MG PO CPDR: 20.0000 mg | DELAYED_RELEASE_CAPSULE | Freq: Every day | ORAL | 1 refills | Status: DC

## 2018-10-17 MED ORDER — OMEPRAZOLE 20 MG PO CPDR: 20.0000 mg | DELAYED_RELEASE_CAPSULE | Freq: Two times a day (BID) | ORAL | 3 refills | Status: DC

## 2018-10-17 MED ORDER — FAMOTIDINE 20 MG PO TABS: 20.0000 mg | ORAL_TABLET | Freq: Two times a day (BID) | ORAL | 3 refills | Status: DC

## 2018-10-17 NOTE — Telephone Encounter (Signed)
Famotidine changed to omeprazole per auto substitution policy due to backorder

## 2018-10-17 NOTE — Telephone Encounter (Signed)
pepcid has been sent to the pharmacy. She has an appt with Dr.Johnson on Monday. If she develops fever, chest pain or worsening shortness of breath she needs to be seen in the ED

## 2018-10-17 NOTE — Telephone Encounter (Signed)
Reroute

## 2018-10-20 ENCOUNTER — Ambulatory Visit: Payer: Self-pay | Attending: Internal Medicine | Admitting: Internal Medicine

## 2018-10-20 ENCOUNTER — Other Ambulatory Visit: Payer: Self-pay

## 2018-10-20 DIAGNOSIS — J302 Other seasonal allergic rhinitis: Secondary | ICD-10-CM

## 2018-10-20 DIAGNOSIS — J40 Bronchitis, not specified as acute or chronic: Secondary | ICD-10-CM

## 2018-10-20 MED ORDER — AZITHROMYCIN 250 MG PO TABS: ORAL_TABLET | ORAL | 0 refills | Status: DC

## 2018-10-20 MED ORDER — PSEUDOEPHEDRINE HCL 30 MG PO TABS: 30.0000 mg | ORAL_TABLET | Freq: Three times a day (TID) | ORAL | 0 refills | Status: DC | PRN

## 2018-10-20 NOTE — Progress Notes (Signed)
Pt states when she coughs she has yellow mucous   Pt states the Tessalon is not helping with her cough   Pt states she has been using the Flonase and zyrtec and it doesn't seem to be helping with her allergies

## 2018-10-20 NOTE — Progress Notes (Signed)
Virtual Visit via Telephone Note Due to current restrictions/limitations of in-office visits due to the COVID-19 pandemic, this scheduled clinical appointment was converted to a telehealth visit  I connected with Tiffany Lamb on 10/20/18 at  4:10 PM EDT by telephone and verified that I am speaking with the correct person using two identifiers. I am in my office.  The patient is at home.  The patient, myself and Cassandria Santee from [employer] [phone])  participated in this encounter.  I discussed the limitations, risks, security and privacy concerns of performing an evaluation and management service by telephone and the availability of in person appointments. I also discussed with the patient that there may be a patient responsible charge related to this service. The patient expressed understanding and agreed to proceed.   History of Present Illness: This was an UC appt  Pt eval by her PCP 10/07/2018 for upper resp symptoms included cough, rhinorrhea and HA.  Treated as allergies with Flonase and Zyrtec.  However not feeling any better. C/o hacking cough productive of yellow phlegm and nasal congestion.  SOB only when she coughs.  No fever, body aches, loss of smell or taste, vomiting or diarrhea.  Endorses some sneezing and itchy nose at times.  No itchy throat or sore throat.  She describes her cough like a dog barking and has a wheezing on her chest.. -no sick contacts at home.  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  States she was told by her PCP not to return to work until symptoms resolve. Uses latex gloves at work 2 wks ago.  Cough started 1 day.  -itching hands, started wheezing and coughing.  -Patient lives at home and has 5 children in the house.  Observations/Objective: No direct observation done as this was a telephone encounter  Assessment and Plan: 1. Bronchitis -We will put her on Zithromax and Sudafed.  Advised to continue  Zyrtec and Flonase. Should also be tested for COVID.  Patient tells me that there is a mobile [facility] that will be coming into her neighborhood tomorrow and she plans to have testing done there.  I have told her to make sure and wear a mask if she goes out to have it done.  I have also sent a message to our [place] testing pool to follow-up with her to make sure that she does get the testing done.  If negative she can return to work.  2. Seasonal allergies See plan above.   Follow Up Instructions: PRN   I discussed the assessment and treatment plan with the patient. The patient was provided an opportunity to ask questions and all were answered. The patient agreed with the plan and demonstrated an understanding of the instructions.   The patient was advised to call back or seek an in-person evaluation if the symptoms worsen or if the condition fails to improve as anticipated.  I provided 35 minutes of non-face-to-face time during this encounter.   Karle Plumber, MD

## 2018-10-21 ENCOUNTER — Telehealth: Payer: Self-pay | Admitting: Internal Medicine

## 2018-10-21 ENCOUNTER — Telehealth: Payer: Self-pay | Admitting: General Practice

## 2018-10-21 ENCOUNTER — Encounter: Payer: Self-pay | Admitting: *Deleted

## 2018-10-21 NOTE — Telephone Encounter (Signed)
TC to patient to home number and cell number x 2 to schedule covid -19 testing. Unable to leave message. No mailbox set up.

## 2018-10-21 NOTE — Telephone Encounter (Signed)
-----   Message from Deborah B Johnson, MD sent at 10/20/2018  4:50 PM EDT ----- Did a tele-visit with this pt today.  She had a tele-visit with her PCP 2 wks ago for upper resp symptoms. Treated as allergies.  She requested another appt today because symptoms persist with nasal congestion and productive whooping cough.  No fever or body aches.  She has 5 children in the home.  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  I have started her on Z-pack and Sudafed.  She tells me that a bus will be near her house tomorrow offering free testing and she may go there to have test done.    

## 2018-10-21 NOTE — Telephone Encounter (Signed)
-----   Message from Ladell Pier, MD sent at 10/20/2018  4:50 PM EDT ----- Did a tele-visit with this pt today.  She had a tele-visit with her PCP 2 wks ago for upper resp symptoms. Treated as allergies.  She requested another appt today because symptoms persist with nasal congestion and productive whooping cough.  No fever or body aches.  She has 5 children in the home.  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  I have started her on Z-pack and Sudafed.  She tells me that a bus will be near her house tomorrow offering free testing and she may go there to have test done.

## 2018-10-21 NOTE — Telephone Encounter (Addendum)
Patient called and spoke to her daughter Angelica, patient was in the car and says she is on her way to [employer] to a mobile unit that will do her test today.    ----- Message from Ladell Pier, MD sent at 10/20/2018  4:50 PM EDT ----- Did a tele-visit with this pt today.  She had a tele-visit with her PCP 2 wks ago for upper resp symptoms. Treated as allergies.  She requested another appt today because symptoms persist with nasal congestion and productive whooping cough.  No fever or body aches.  She has 5 children in the home.  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  I have started her on Z-pack and Sudafed.  She tells me that a bus will be near her house tomorrow offering free testing and she may go there to have test done.

## 2019-03-21 ENCOUNTER — Emergency Department (HOSPITAL_COMMUNITY)
Admission: EM | Admit: 2019-03-21 | Discharge: 2019-03-21 | Disposition: A | Discharge status: Home / Self Care | Payer: Self-pay | Attending: Emergency Medicine | Admitting: Emergency Medicine

## 2019-03-21 ENCOUNTER — Other Ambulatory Visit: Payer: Self-pay

## 2019-03-21 DIAGNOSIS — E119 Type 2 diabetes mellitus without complications: Secondary | ICD-10-CM | POA: Insufficient documentation

## 2019-03-21 DIAGNOSIS — Z91040 Latex allergy status: Secondary | ICD-10-CM | POA: Insufficient documentation

## 2019-03-21 DIAGNOSIS — K0889 Other specified disorders of teeth and supporting structures: Secondary | ICD-10-CM | POA: Insufficient documentation

## 2019-03-21 MED ORDER — DEXAMETHASONE SODIUM PHOSPHATE 10 MG/ML IJ SOLN
10.0000 mg | Freq: Once | INTRAMUSCULAR | Status: AC
  Administered 2019-03-21: 05:00:00 10 mg via INTRAVENOUS
  Filled 2019-03-21: qty 1

## 2019-03-21 MED ORDER — KETOROLAC TROMETHAMINE 30 MG/ML IJ SOLN
30.0000 mg | Freq: Once | INTRAMUSCULAR | Status: AC
  Administered 2019-03-21: 30 mg via INTRAVENOUS
  Filled 2019-03-21: qty 1

## 2019-03-21 MED ORDER — SODIUM CHLORIDE 0.9 % IV BOLUS
1000.0000 mL | Freq: Once | INTRAVENOUS | Status: AC
  Administered 2019-03-21: 1000 mL via INTRAVENOUS

## 2019-03-21 MED ORDER — CLINDAMYCIN HCL 150 MG PO CAPS: 450.0000 mg | ORAL_CAPSULE | Freq: Three times a day (TID) | ORAL | 0 refills | Status: AC

## 2019-03-21 MED ORDER — CLINDAMYCIN PHOSPHATE 600 MG/50ML IV SOLN
600.0000 mg | Freq: Once | INTRAVENOUS | Status: AC
  Administered 2019-03-21: 05:00:00 600 mg via INTRAVENOUS
  Filled 2019-03-21: qty 50

## 2019-03-21 NOTE — ED Triage Notes (Signed)
Pt c/o pain in lower, left jaw and left ear pain for 5 days. Saw a dentist and was prescribed abx and pain medication 2 days ago but states the meds are not helping.

## 2019-03-21 NOTE — ED Notes (Signed)
Pt verbalized understanding of d/c instructions via [place] interpreter. VSS, NAD.

## 2019-03-21 NOTE — ED Provider Notes (Signed)
[place] EMERGENCY DEPARTMENT Provider Note   CSN: [id number] Arrival date & time: 03/21/19  0048     History   Chief Complaint Chief Complaint  Patient presents with  . Dental Pain    HPI Tiffany Lamb is a 38 y.o. female presenting with dental pain and facial swelling.  The patient reports that she developed left-sided lower dental pain approximately 5 days ago.  She reports that the pain is constant and radiates to her left jaw and left ear.  She was seen by her dentist and was started on Augmentin and was discharged with pain medication and ibuprofen.  She was told that she will have to have her wisdom teeth removed on the left lower side, but she has associated infection with swelling that needs to improve first.  She reports that she has taken approximately 4 doses of Augmentin, but her pain seems to have worsened.  She also feels as if the left side of her face is more swollen.  She denies drooling, trismus, fever, chills, purulent drainage from the area, neck stiffness, muffled voice, throat closing, or shortness of breath.  Also reports decreased p.o. intake by mouth.  States that her lips are dry and cracked and she has barely been drinking for most of the day due to the pain.     The history is provided by the patient. No language interpreter was used.    Past Medical History:  Diagnosis Date  . Anemia   . Depression   . Diabetes mellitus without complication [facility])     Patient Active Problem List   Diagnosis Date Noted  . Morbid obesity ([place]) 10/07/2018  . Gestational diabetes 05/27/2017  . Advanced maternal age in multigravida 02/06/2017  . [place] multiparity 02/06/2017  . Supervision of high risk pregnancy, antepartum 01/09/2017  . Depression affecting pregnancy in second trimester, antepartum 11/26/2016  . Maternal pregestational diabetes classes B through R, antepartum 11/26/2016    Past Surgical History:  Procedure Laterality  Date  . gall bladder  2007     OB History    Gravida  7   Para  7   Term  7   Preterm      AB      Living  7     SAB      TAB      Ectopic      Multiple  0   Live Births  7            Home Medications    Prior to Admission medications   Medication Sig Start Date End Date Taking? Authorizing Provider  ibuprofen (ADVIL) 200 MG tablet Take 200 mg by mouth every 6 (six) hours as needed for mild pain.   Yes [provider]  clindamycin (CLEOCIN) 150 MG capsule Take 3 capsules (450 mg total) by mouth 3 (three) times daily for 5 days. 03/21/19 03/26/19  Aariz Maish A, PA-C  cetirizine (ZYRTEC) 10 MG tablet Take 1 tablet (10 mg total) by mouth daily. Patient not taking: Reported on 03/21/2019 10/07/18 03/21/19  Gildardo Pounds, NP  Ferrous Sulfate (IRON) 325 (65 Fe) MG TABS Take 1 tablet (325 mg total) by mouth 2 (two) times daily. Patient not taking: Reported on 10/07/2018 05/02/17 03/21/19  Chancy Milroy, MD  fluticasone [facility] 50 MCG/ACT nasal spray Place 2 sprays into both nostrils daily. Patient not taking: Reported on 03/21/2019 10/07/18 03/21/19  Gildardo Pounds, NP  omeprazole ([place]) 20  MG capsule Take 1 capsule (20 mg total) by mouth daily before breakfast for 30 days. Patient not taking: Reported on 03/21/2019 10/17/18 03/21/19  Claiborne Rigg, NP    Family History Family History  Problem Relation Age of Onset  . Hepatitis Mother   . Heart disease Brother   . Cancer Paternal Aunt        breast  . Cancer Paternal Grandmother        liver  . Cancer Paternal Grandfather        gastric  . Diabetes Paternal Grandfather     Social History Social History   Tobacco Use  . Smoking status: Never Smoker  . Smokeless tobacco: Never Used  Substance Use Topics  . Alcohol use: No  . Drug use: No     Allergies   Latex   Review of Systems Review of Systems  Constitutional: Negative for activity change, chills and fever.  HENT:  Positive for dental problem and facial swelling. Negative for nosebleeds, postnasal drip, sinus pressure, sinus pain, sore throat, trouble swallowing and voice change.   Eyes: Negative for visual disturbance.  Respiratory: Negative for shortness of breath and wheezing.   Cardiovascular: Negative for chest pain and palpitations.  Gastrointestinal: Negative for abdominal pain, diarrhea, nausea and vomiting.  Genitourinary: Negative for dysuria.  Musculoskeletal: Negative for back pain.  Skin: Negative for rash.  Allergic/Immunologic: Negative for immunocompromised state.  Neurological: Negative for syncope, weakness, numbness and headaches.  Psychiatric/Behavioral: Negative for confusion.   Physical Exam Updated Vital Signs BP 100/61   Pulse 80   Temp 98.2 F (36.8 C) (Oral)   Resp 16   SpO2 98%   Physical Exam Vitals signs and nursing note reviewed.  Constitutional:      General: She is not in acute distress.    Appearance: She is not ill-appearing, toxic-appearing or diaphoretic.  HENT:     Head: Normocephalic.     Right Ear: Ear canal normal.     Left Ear: Tympanic membrane and ear canal normal.     Mouth/Throat:      Comments: Left lower third molar is partially impacted into the surrounding gingiva.  No evidence of dental abscess.  There is no drainage that is actively draining or able to be expressed.  Lips are dry and cracked Eyes:     Conjunctiva/sclera: Conjunctivae normal.  Neck:     Musculoskeletal: Neck supple.     Comments: Left-sided anterior cervical lymphadenopathy.  No meningismus. Cardiovascular:     Rate and Rhythm: Normal rate and regular rhythm.     Heart sounds: No murmur. No friction rub. No gallop.   Pulmonary:     Effort: Pulmonary effort is normal. No respiratory distress.     Breath sounds: No stridor. No wheezing, rhonchi or rales.  Chest:     Chest wall: No tenderness.  Abdominal:     General: There is no distension.     Palpations: Abdomen  is soft. There is no mass.     Tenderness: There is no abdominal tenderness. There is no right CVA tenderness, left CVA tenderness, guarding or rebound.     Hernia: No hernia is present.  Skin:    General: Skin is warm.     Capillary Refill: Capillary refill takes less than 2 seconds.     Findings: No rash.  Neurological:     Mental Status: She is alert.  Psychiatric:        Behavior: Behavior normal.  ED Treatments / Results  Labs (all labs ordered are listed, but only abnormal results are displayed) Labs Reviewed - No data to display  EKG None  Radiology No results found.  Procedures Procedures (including critical care time)  Medications Ordered in ED Medications  sodium chloride 0.9 % bolus 1,000 mL (0 mLs Intravenous Stopped 03/21/19 0715)  dexamethasone (DECADRON) injection 10 mg (10 mg Intravenous Given 03/21/19 0525)  ketorolac (TORADOL) 30 MG/ML injection 30 mg (30 mg Intravenous Given 03/21/19 0525)  clindamycin (CLEOCIN) IVPB 600 mg (0 mg Intravenous Stopped 03/21/19 0555)     Initial Impression / Assessment and Plan / ED Course  I have reviewed the triage vital signs and the nursing notes.  Pertinent labs & imaging results that were available during my care of the patient were reviewed by me and considered in my medical decision making (see chart for details).        38 year old female presenting with left-sided lower dental pain that radiates to her left ear and jaw for the last 5 days.  No constitutional symptoms.  She has been on Augmentin for the last 2 days that was prescribed by her dentist.  She needs to have her wisdom teeth removed after the infection and swelling around the tooth has resolved.  On exam, there is no obvious dental abscess.  She has no trismus or muffled voice.  No meningismus.  She does have considerable cervical lymphadenopathy on the left anterior cervical chain.  However, she is tolerating her secretions well and the posterior  oropharynx is patent.  Her lips are dry and cracked.  We will treat with IV fluids and give her dose of Toradol and Decadron as she has no concerns for pregnancy.  We will also give her a dose of IV clindamycin and switch her antibiotics to clindamycin given that swelling appears to have worsened despite 48 hours of being on Augmentin.  Following her treatment in the ER, she feels markedly improved.  She has been successfully fluid challenge in the ER.  She has a follow-up appointment with her dentist early this week.  At this time, feel that she needs no further urgent or emergent work-up. At this time, I think it is reasonable to discharge her to home with outpatient follow-up to dentistry.  Low suspicion for retropharyngeal abscess, peritonsillar abscess, or deep space infection of the neck.  She is hemodynamically stable and in no acute distress.  Safe for discharge home with outpatient follow-up as indicated.  Final Clinical Impressions(s) / ED Diagnoses   Final diagnoses:  Dentalgia    ED Discharge Orders         Ordered    clindamycin (CLEOCIN) 150 MG capsule  3 times daily     03/21/19 0647           Barkley BoardsMcDonald, Tyvon Eggenberger A, PA-C 03/21/19 0744    Dione BoozeGlick, David, MD 03/21/19 [phone]

## 2019-03-21 NOTE — Discharge Instructions (Addendum)
Gracias por permitirme atenderlo [employer] de Emergencias.  Acuda a la cita de seguimiento con su dentista.  Deje de tomar Augmentin (amoxicilina) y comience a tomar clindamicina. Le dieron su primera dosis en la sala de emergencias. Tome 3 cpsulas por va oral cada 8 horas durante los prximos 5 das.  Contine tomando ibuprofeno y sus analgsicos segn lo prescrito por Paediatric nurse.  Regrese al departamento de emergencias si presenta babeo, fiebre alta, si deja de producir orina, si no puede abrir la boca o presenta otros sntomas nuevos que le preocupen.  Thank you for allowing me to care for you today in the Emergency Department.   Please keep your follow-up appointment with your dentist.   Stop taking the Augmentin (amoxicillin) and start taking clindamycin.  You were given your first dose in the ER.  Take 3 capsules by mouth every 8 hours for the next 5 days.  Continue to take ibuprofen and your pain medication as prescribed by dentistry.  Return to the emergency department if you develop drooling, high fevers, if you stop making urine, if you become unable to open your mouth, or develop other new, concerning symptoms.

## 2019-07-23 ENCOUNTER — Telehealth: Payer: Self-pay | Admitting: Pediatric Intensive Care

## 2019-07-23 NOTE — Telephone Encounter (Signed)
Call to client via [facility] Interpretation [phone]. ID verified x 2. Client states history of gestational diabetes 2 years ago. She states that she has been monitoring her blood glucose at home which ranges from 130-160 fasting to 200s in the evening. She denies polys however states that she has episodes of dizziness and headaches. She was a patient of [facility] previously but has not had PCP care in a year. Client also states dental issues and will need [facility] for dental referral via PCP. CN advised client regarding [facility] at [facility] on Tuesday 3/23. Client can come in afternoon for be evaluated between 4:30-5pm. Shann Medal RN BSN CNP [phone]

## 2019-07-24 MED ORDER — METFORMIN HCL 500 MG PO TABS: 500.0000 mg | ORAL_TABLET | Freq: Two times a day (BID) | ORAL | 0 refills | Status: AC

## 2019-07-24 MED FILL — metFORMIN HCL 500 MG TABS: 500 | 30 days supply | Qty: 60 | Fill #0

## 2019-07-24 NOTE — Addendum Note (Signed)
Addended by: Storm Frisk on: 07/24/2019 07:41 AM   Modules accepted: Orders

## 2019-07-24 NOTE — Telephone Encounter (Signed)
I sent metformin 500mg  bid [facility]   She needs [facility] f/u soon

## 2019-07-28 ENCOUNTER — Telehealth: Payer: Self-pay

## 2019-07-28 ENCOUNTER — Ambulatory Visit: Payer: Self-pay | Admitting: Family Medicine

## 2019-07-28 ENCOUNTER — Other Ambulatory Visit: Payer: Self-pay

## 2019-07-28 DIAGNOSIS — R5383 Other fatigue: Secondary | ICD-10-CM

## 2019-07-28 DIAGNOSIS — Z789 Other specified health status: Secondary | ICD-10-CM

## 2019-07-28 DIAGNOSIS — L659 Nonscarring hair loss, unspecified: Secondary | ICD-10-CM

## 2019-07-28 DIAGNOSIS — D649 Anemia, unspecified: Secondary | ICD-10-CM

## 2019-07-28 DIAGNOSIS — F330 Major depressive disorder, recurrent, mild: Secondary | ICD-10-CM

## 2019-07-28 DIAGNOSIS — R7309 Other abnormal glucose: Secondary | ICD-10-CM

## 2019-07-28 DIAGNOSIS — R739 Hyperglycemia, unspecified: Secondary | ICD-10-CM

## 2019-07-28 DIAGNOSIS — Z8632 Personal history of gestational diabetes: Secondary | ICD-10-CM

## 2019-07-28 LAB — POCT URINALYSIS DIP (CLINITEK)
Bilirubin, UA: NEGATIVE
Glucose, UA: NEGATIVE mg/dL
Ketones, POC UA: NEGATIVE mg/dL
Leukocytes, UA: NEGATIVE
Nitrite, UA: NEGATIVE
Spec Grav, UA: 1.025 (ref 1.010–1.025)
Urobilinogen, UA: 0.2 E.U./dL
pH, UA: 6.5 (ref 5.0–8.0)

## 2019-07-28 LAB — POCT URINE PREGNANCY: Preg Test, Ur: NEGATIVE

## 2019-07-28 LAB — POCT GLYCOSYLATED HEMOGLOBIN (HGB A1C): Hemoglobin A1C: 4.7 % (ref 4.0–5.6)

## 2019-07-28 MED ORDER — FAMOTIDINE 20 MG PO TABS: 20.0000 mg | ORAL_TABLET | Freq: Two times a day (BID) | ORAL | 2 refills | Status: AC | PRN

## 2019-07-28 MED ORDER — RELION TRUE METRIX TEST STRIPS VI STRP: ORAL_STRIP | 12 refills | Status: AC

## 2019-07-28 MED ORDER — TRUE METRIX METER W/DEVICE KIT: PACK | 0 refills | Status: AC

## 2019-07-28 MED ORDER — LANCETS THIN MISC: 1 refills | Status: AC

## 2019-07-28 MED ORDER — SERTRALINE HCL 25 MG PO TABS: 25.0000 mg | ORAL_TABLET | Freq: Every day | ORAL | 1 refills | Status: AC

## 2019-07-28 MED FILL — FAMOTIDINE 20 MG TABS: 20 | 30 days supply | Qty: 60 | Fill #0

## 2019-07-28 MED FILL — TRUE METRIX BLOOD GLUCOSE M: W/DEVICE | 30 days supply | Qty: 1 | Fill #0

## 2019-07-28 MED FILL — SERTRALINE HCL 25 MG TABLET: 25 | 30 days supply | Qty: 30 | Fill #0

## 2019-07-28 MED FILL — TRUEplus LANCETS 33G MISC: 30 days supply | Qty: 100 | Fill #0

## 2019-07-28 MED FILL — TRUE METRIX GLUCOSE TEST ST: 25 days supply | Qty: 50 | Fill #0

## 2019-07-28 NOTE — Progress Notes (Signed)
New Patient Office Visit  Subjective:  Patient ID: Tiffany Lamb, female    DOB: June 19, 1980  Age: 39 y.o. MRN: [id number]  CC:  Chief Complaint  Patient presents with  . Hyperglycemia   HPI Tiffany Lamb presents for evaluation of recent onset hyperglycemia.Patient brings in photos of her glucometer with readings ranging from mid 140-280 over the last 2-3 weeks. She endorses checking her blood sugar either fasting or 2-3 hours following a meal. Medical history significant for gestational diabetes requiring medication management during pregnancy. She is inactive of routine physical activity. Body mass index is 41.15 kg/m. She endorses a nonrestrictive diet. She is also concern for fatigue, feeling down, stressed, and anxious. Previously prescribed Zoloft and stopped taking not long following last pregnancy.    Past Medical History:  Diagnosis Date  . Anemia   . Depression   . Diabetes mellitus without complication [facility])     Past Surgical History:  Procedure Laterality Date  . gall bladder  2007    Family History  Problem Relation Age of Onset  . Hepatitis Mother   . Heart disease Brother   . Cancer Paternal Aunt        breast  . Cancer Paternal Grandmother        liver  . Cancer Paternal Grandfather        gastric  . Diabetes Paternal Grandfather     Social History   Socioeconomic History  . Marital status: Single    Spouse name: Not on file  . Number of children: Not on file  . Years of education: Not on file  . Highest education level: Not on file  Occupational History  . Not on file  Tobacco Use  . Smoking status: Never Smoker  . Smokeless tobacco: Never Used  Substance and Sexual Activity  . Alcohol use: No  . Drug use: No  . Sexual activity: Never    Birth control/protection: None  Other Topics Concern  . Not on file  Social History Narrative  . Not on file   Social Determinants of Health   Financial Resource Strain:   . Difficulty of  Paying Living Expenses:   Food Insecurity:   . Worried About Charity fundraiser in the Last Year:   . Arboriculturist in the Last Year:   Transportation Needs:   . Film/video editor (Medical):   Marland Kitchen Lack of Transportation (Non-Medical):   Physical Activity:   . Days of Exercise per Week:   . Minutes of Exercise per Session:   Stress:   . Feeling of Stress :   Social Connections:   . Frequency of Communication with Friends and Family:   . Frequency of Social Gatherings with Friends and Family:   . Attends Religious Services:   . Active Member of Clubs or Organizations:   . Attends Archivist Meetings:   Marland Kitchen Marital Status:   Intimate Partner Violence:   . Fear of Current or Ex-Partner:   . Emotionally Abused:   Marland Kitchen Physically Abused:   . Sexually Abused:     ROS Review of Systems Pertinent negatives listed in HPI Objective:   Today's Vitals: BP 115/87 (BP Location: Left Arm, Patient Position: Sitting, Cuff Size: Normal)   Pulse 88   Temp 98.7 F (37.1 C) (Oral)   Resp 18   Ht _0  (1.575 m)   Wt 225 lb (102.1 kg)   SpO2 100%   BMI 41.15 kg/m  Physical Exam Constitutional:      Appearance: She is obese.  Eyes:     Extraocular Movements: Extraocular movements intact.     Pupils: Pupils are equal, round, and reactive to light.  Cardiovascular:     Rate and Rhythm: Normal rate and regular rhythm.  Pulmonary:     Effort: Pulmonary effort is normal.     Breath sounds: Normal breath sounds.  Abdominal:     General: There is no distension.     Tenderness: There is no abdominal tenderness.  Musculoskeletal:        General: Normal range of motion.     Cervical back: Normal range of motion. No rigidity or tenderness.  Lymphadenopathy:     Cervical: No cervical adenopathy.  Skin:    General: Skin is warm and dry.     Capillary Refill: Capillary refill takes less than 2 seconds.  Neurological:     General: No focal deficit present.     Mental Status:  She is alert and oriented to person, place, and time.  Psychiatric:        Attention and Perception: Attention normal.        Mood and Affect: Affect is flat.        Speech: Speech normal.        Behavior: Behavior normal.        Assessment & Plan:   Problem List Items Addressed This Visit      Other   Morbid obesity ([place]) - Primary, Body mass index is 41.15 kg/m.    Relevant Orders   Basic metabolic panel   Lipid panel    Other Visit Diagnoses    History of gestational diabetes       Relevant Orders   HgB A1c (Completed)   POCT URINALYSIS DIP (CLINITEK) (Completed)   POCT urine pregnancy (Completed)   Fructosamine   Mild episode of recurrent major depressive disorder (HCC)       Relevant Medications   sertraline (ZOLOFT) 25 MG tablet   Other Relevant Orders   Ambulatory referral to Social Work, for depression evaluation    Other fatigue       Relevant Orders   Thyroid Panel With TSH   Hyperglycemia       Relevant Orders   Fructosamine, checking as A1C of 4.7% is not correlating with home glucose readings.  Patient previously prescribed metformin, advised to hold medication until labs result. Check blood sugar twice daily fasting and prior to bedtime   Basic metabolic panel   Hemoglobin A1c less than 7.0%       Hair loss    TSH pending    Anemia, unspecified type       Relevant Orders   CBC with Differential   Iron, TIBC and Ferritin Panel      Outpatient Encounter Medications as of 07/28/2019  Medication Sig  . Blood Glucose Monitoring Suppl (TRUE METRIX METER) w/Device KIT Dispense to check blood sugar twice daily, AM and PM E11.9  . famotidine (PEPCID) 20 MG tablet Take 1 tablet (20 mg total) by mouth 2 (two) times daily as needed for heartburn or indigestion.  Marland Kitchen glucose blood (RELION TRUE METRIX TEST STRIPS) test strip Check blood sugar twice daily  E.11.9  . Lancets Thin MISC Check blood sugar E.11.9  . metFORMIN (GLUCOPHAGE) 500 MG tablet Take 1 tablet  (500 mg total) by mouth 2 (two) times daily with a meal. (Patient not taking: Reported on 07/28/2019)  . sertraline (ZOLOFT)  25 MG tablet Take 1 tablet (25 mg total) by mouth daily.  . [DISCONTINUED] cetirizine (ZYRTEC) 10 MG tablet Take 1 tablet (10 mg total) by mouth daily. (Patient not taking: Reported on 03/21/2019)  . [DISCONTINUED] Ferrous Sulfate (IRON) 325 (65 Fe) MG TABS Take 1 tablet (325 mg total) by mouth 2 (two) times daily. (Patient not taking: Reported on 10/07/2018)  . [DISCONTINUED] fluticasone (FLONASE) 50 MCG/ACT nasal spray Place 2 sprays into both nostrils daily. (Patient not taking: Reported on 03/21/2019)  . [DISCONTINUED] ibuprofen (ADVIL) 200 MG tablet Take 200 mg by mouth every 6 (six) hours as needed for mild pain.  . [DISCONTINUED] omeprazole (PRILOSEC) 20 MG capsule Take 1 capsule (20 mg total) by mouth daily before breakfast for 30 days. (Patient not taking: Reported on 03/21/2019)   No facility-administered encounter medications on file as of 07/28/2019.    Follow-up: 4 weeks with PCP to recheck A1C ,review home glucose readings, and evaluate effectiveness of depression medication    Molli Barrows, FNP

## 2019-07-28 NOTE — Patient Instructions (Addendum)
Continue to check your blood sugar in the morning fasting and prior to bedtime.  New blood sugar meter has been ordered.  Hold metformin until I receive your lab results. I will update you on your MyChart once I receive your labs results. [place] will contact you regarding your medications and COVID 19 vaccine.    Fatiga Fatigue Si siente fatiga, tiene una sensacin de cansancio en todo momento y falta de energa o falta de motivacin. La fatiga puede dificultar el inicio o la realizacin de tareas debido al agotamiento. Por lo general, la fatiga ocasional o leve con frecuencia es una reaccin normal a la actividad o la vida. Sin embargo, la fatiga de Engineer, site duracin (crnica) o extrema puede ser sntoma de una enfermedad. Siga estas indicaciones en su casa: Instrucciones generales  Controle su fatiga para ver si hay cambios.  Acustese y levntese a la misma hora todos [place].  Evite la fatiga moderando su ritmo [place] y durmiendo lo suficiente durante la noche.  Mantenga un peso saludable. [place] los medicamentos de venta libre y los recetados solamente como se lo haya indicado el mdico.  Tome una multivitamina, si se lo indic el mdico.  No tome ningn complemento alimenticio o a base de hierbas a menos que lo haya autorizado el mdico. Actividad   Realice ejercicio con regularidad como se lo haya indicado el mdico.  Practique tcnicas que lo ayuden a Nurse, children's, como yoga, tai chi, meditacin, terapia de masajes. Comida y bebida   Evite las comidas abundantes a la noche.  Consuma una dieta bien balanceada que incluya protenas magras, cereales integrales, abundantes frutas, verduras y productos lcteos descremados.  Evite el consumo excesivo de cafena.  Evite el consumo de alcohol.  Beba suficiente lquido para Consulting civil engineer orina de color amarillo plido. [place] situaciones que le provocan estrs. Trate de que su horario  de [place] y personal estn equilibrados.  No consuma ningn producto que contenga nicotina o tabaco, como cigarrillos y Psychologist, sport and exercise. Si necesita ayuda para dejar de fumar, consulte al mdico.  No consuma drogas. Comunquese con un mdico si:  La fatiga no mejora.  Tiene fiebre.  Pierde peso o [place] de peso de forma repentina.  Tiene dolores de [place].  Tiene problemas para conciliar el sueo o para dormir en la noche.  Se siente enojado, culpable, ansioso o triste.  No puede defecar (estreimiento).  Tiene la piel seca.  Observa hinchazn en las piernas u otras partes del cuerpo. Solicite ayuda de inmediato si:  Se siente confundido.  Tiene visin borrosa.  Tiene mareos o se desmaya.  Tiene un dolor de cabeza intenso.  Siente dolor intenso en el abdomen, la espalda o el rea entre la cintura y las caderas (pelvis).  Tiene dolor de pecho, dificultad para respirar, o latidos cardacos irregulares o acelerados.  No puede orinar u [employer] de lo normal.  Presenta sangrado anormal, como sangrado del recto, la vagina, la nariz, los pulmones o los pezones.  Vomita sangre.  Tiene pensamientos acerca de lastimarse a usted mismo o a Producer, television/film/video. Si alguna vez siente que puede lastimarse o Market researcher a los dems, o tiene pensamientos de poner fin a su vida, busque ayuda de inmediato. Puede dirigirse al servicio de emergencias ms cercano o comunicarse con:  El servicio de Therapist, sports (911 en los [place]).  Una lnea de asistencia al suicida y Freight forwarder en crisis, como la [place]  Prevencin del Suicidio (National Suicide Prevention Lifeline) al [phone]. Est disponible las 24 horas del da. Resumen  Si siente fatiga, tiene una sensacin de cansancio en todo momento y falta de energa o falta de motivacin.  La fatiga puede dificultar el inicio o la realizacin de tareas debido al agotamiento.  La fatiga de larga duracin  (crnica) o extrema puede ser sntoma de una enfermedad.  Realice ejercicio con regularidad como se lo haya indicado el mdico.  Cambie las situaciones que le provocan estrs. Trate de que su horario de [place] y personal estn equilibrados. Esta informacin no tiene Marine scientist el consejo del mdico. Asegrese de hacerle al mdico cualquier pregunta que tenga. Document Revised: 04/10/2017 Document Reviewed: 04/10/2017 Elsevier Patient Education  [place] de la diabetes mellitus tipo 2 Preventing Type 2 Diabetes Mellitus La diabetes tipo 2 (diabetes mellitus tipo 2) es una enfermedad a Production designer, theatre/television/film plazo (crnica) que afecta los niveles de azcar en la sangre (glucosa). Normalmente, una hormona denominada insulina permite el ingreso de la glucosa en las clulas del cuerpo. Las clulas usan la glucosa para Dealer. En la diabetes tipo 2, puede presentarse uno de los siguientes problemas, o ambos:  El cuerpo no produce la cantidad suficiente de insulina.  El cuerpo no responde de [place] a la insulina que produce (resistencia a la insulina). La resistencia a la insulina o la falta de insulina hacen que el exceso de glucosa se acumule en la sangre, en lugar de ir a las clulas. Como resultado, se produce un nivel alto de glucemia (hiperglucemia), lo cual puede causar muchas complicaciones. Al tener sobreseo o ser obeso y tener un estilo de vida inactivo (sedentario) puede aumentar su riesgo de padecer diabetes. La diabetes tipo 2 se puede retrasar o prevenir al realizar determinados cambios en la alimentacin y el estilo de vida. Qu cambios se pueden Engineer, structural?  Consuma comidas y refrigerios saludables con regularidad. Tenga a mano un refrigerio saludable cuando sienta [employer], tal como una fruta o un puado de nueces.  Como carnes [place] y protenas con bajo contenido de grasas saturadas como pollo, pescado, claras de  huevo y frijoles. Evite las carnes procesadas.  Coma muchas frutas y verduras, y muchos granos no procesados (granos enteros). Se recomienda que coma: ? 1 a 2 tazas de [employer]. ? 2 a 3 tazas de [employer]. ? 6 a 8 onzas de granos enteros todos los das, como avena, [place], trigo [place], arroz integral, quinoa y mijo.  Coma productos lcteos de bajo contenido graso, como [place], yogur y [place].  Coma alimentos que contengan grasas saludables, como frutos secos, aguacate, aceite de [place] y aceite de canola.  Beba agua [place]. Evite los lquidos que contengan azcar agregada como gaseosas o t [place].  Siga las indicaciones de su mdico respecto de las restricciones especficas para las comidas o las bebidas.  Controle la cantidad de alimentos que come por vez (tamao de la porcin). ? Verifique las etiquetas de los alimentos para verificar los tamaos de las porciones de los alimentos. ? Use una balanza de cocina para pesar las porciones de alimentos.  [place]tee o hierva al vapor los Building surveyor de frerlos. Cocine con agua o caldo en lugar de aceites o manteca.  Limite su consumo de: ? Sal (sodio). No consuma ms de 1 cucharadita (2,469m) de sodio por da. Si tiene enfermedad cardaca o presin  arterial alta, debe consumir menos de  a  cucharadita (1,533m) de sodio por da. ? Grasas saturadas. Se trata de grasa slida a temperatura aSunTrusto la grasa de la carne. Qu cambios en el estilo de vida se pueden realizar?  Actividad  Haga actividad fsica de intensidad moderada durante al menos 350mutos como mnimo 5das por semana o con la frecuencia que le indique su mdico.  Pregntele al mdico qu actividades son seguras para usted. Una combinacin de actividades puede ser la mejor opcin, por ejemplo, caminar, practicar natacin, andar en bicicleta y hacer entrenamiento de fuerza.  Trate de agregar actividad fsica a  su jornada. Por ejemplo: ? Estacione en lugares ms alejados de lo habitual para tener que caminar ms. Por ejemplo, estacione en una equina alejada del estacionamiento cuando vaya a la oficina o a la tienda de comestibles. ? Vaya a caminar durante su hora de almuerzo. ? Utilice las esClinical cytogeneticiste ascensores o escaleras mecnicas. Bajar de peso  [place] peso segn se le indique. El mdico puede determinar cuntos kilos tiene que bajar y ay[place] que adelgace de maGeographical information systems officeregura.  Si tiene sobrepeso o es obeso, es posible que se le indique que pierda al menos entre el 5 y el 7% de su peEngineer, siteAlcohol y tabaco   Limite el consumo de alcohol a no ms de 1 medida por da si es mujer y no est emMusic therapist a 2 medidas por da si es hombre. Una medida equivale a 12onzas de cerveza, 5onzas de vino o 1onzas de bebidas alcohlicas de alta graduacin.  No consuma ningn producto que contenga tabaco, lo que incluye cigarrillos, tabaco de maHigher education careers adviser ciPsychologist, sport and exerciseSi necesita ayuda para dejar de fumar, consulte al mdico. Trabaje con su mdico  Contrlese la glucemia de maTehachapiegular, segn lo indicado por su mdico.  Analice sus factores de riesgo y cmo puede reducir su riesgo de padecer diabetes.  Somtase a pruebas de deProgramme researcher, broadcasting/film/videoegn lo indicado por su mdico. Puede realizarse pruebas de deteccin con regularidad, especialmente si tiene ciertos factores de riesgo de padecer diabetes tipo 2.  Haga una cita con un especialista en dietas y nutricin (nutricionista matriculado). Un nutricionista matriculado puede ayudarlo a crear un plan de alimentacin saludable y a comprender los tamaos de las porciones y las etiquetas de los alimentos. Por qu son importantes estos cambios?  Es posible prevenir o reNurse, children'siabetes tipo 2 y los problemas de salud relacionados al realizar cambios en el estilo de vida y laYouth worker Puede ser difcil reconocer los signos de la  diabetes tipo 2. La mejor manera de evitar posibles daos en el cuerpo es tomar medidas para prevenir la enfermedad antes que usted desarrolle los sntomas. Qu puede suceder si no se realizan cambios?  Sus niveles de glucemia pueden continuar aumentando. Tener la glucemia alta durante mucho tiempo es peligroso. Demasiada glucosa en la sangre puede daar los vasos sanguneos, el corazn, los riones, los nervios y los ojos.  Puede desarrollar prediabetes o diabetes tipo 2. La diabetes tipo 2 puede ocasionar muchos problemas de salud crnicos y complicaciones, tales como: ? Enfermedad cardaca. ? Accidente cerebrovascular. ? Ceguera. ? Enfermedad renal. ? Depresin. ? Circulacin deficiente en los pies y las piernas, lo cual podra ocasionar una extirpacin quirrgica (amputacin) en casos graves. Dnde encontrar ap44Pida a su mdico que le recomiende un nutricionista matriculado, unRadio broadcast assistantn diabetes o un programa para baSports coache  peso.  Busque grupos de apoyo para bajar de peso a Social worker o en lnea.  Asista a un gimnasio, club de acondicionamiento fsico o nase a un grupo que realice actividad fsica al aire libre como un club de caminata. Dnde encontrar ms informacin Para obtener ms informacin sobre la diabetes y la prevencin de la diabetes, visite:  Asociacin Americana de la Diabetes (American Diabetes Association, ADA): www.diabetes.[place] Diabetes y las Enfermedades Digestivas y Renales [facility] of Diabetes and Digestive and Kidney Diseases): FindSpin.nl Para obtener ms informacin sobre [employer], visite:  Departamento de Agricultura de los EE.UU., Mi plato (U.S. Department of Agriculture, Engineer, structural, Choose My Plate): http://wiley-williams.com/  Oficina para la Prevencin de Enfermedades y Promocin de Passenger transport manager, Games developer de Designer, multimedia (Office of Disease Prevention and Health  Promotion, ODPHP, Dietary Guidelines): SurferLive.at Resumen  Puede reducir su riesgo de padecer diabetes tipo 2 al aumentar su actividad fsica, comer alimentos saludables y Sports coach de peso segn se lo indiquen.  Hable con su mdico sobre su riesgo de padecer diabetes tipo 2. Pregunte [employer]  de sangre o pruebas de deteccin que debe Dispensing optician. Esta informacin no tiene Marine scientist el consejo del mdico. Asegrese de hacerle al mdico cualquier pregunta que tenga. Document Revised: 08/13/2016 Document Reviewed: 06/14/2015 Elsevier Patient Education  [place].

## 2019-07-28 NOTE — Telephone Encounter (Signed)
This CM spoke to Turkey Hussey,RN/CNP regarding patient re-establishing care at [facility].  She said that she will speak to the patient about follow up after the patient sees the provider on the [facility] mobile unit this afternoon and then contact this CM about scheduling an appointment

## 2019-07-29 ENCOUNTER — Encounter (HOSPITAL_COMMUNITY): Payer: Self-pay | Admitting: Emergency Medicine

## 2019-07-29 ENCOUNTER — Telehealth: Payer: Self-pay | Admitting: *Deleted

## 2019-07-29 ENCOUNTER — Emergency Department (HOSPITAL_COMMUNITY)
Admission: EM | Admit: 2019-07-29 | Discharge: 2019-07-30 | Disposition: A | Discharge status: Home / Self Care | Payer: Self-pay | Attending: Emergency Medicine | Admitting: Emergency Medicine

## 2019-07-29 DIAGNOSIS — Z91040 Latex allergy status: Secondary | ICD-10-CM | POA: Insufficient documentation

## 2019-07-29 DIAGNOSIS — D649 Anemia, unspecified: Secondary | ICD-10-CM | POA: Insufficient documentation

## 2019-07-29 DIAGNOSIS — E611 Iron deficiency: Secondary | ICD-10-CM

## 2019-07-29 DIAGNOSIS — Z79899 Other long term (current) drug therapy: Secondary | ICD-10-CM | POA: Insufficient documentation

## 2019-07-29 LAB — THYROID PANEL WITH TSH
Free Thyroxine Index: 1.3 (ref 1.2–4.9)
T3 Uptake Ratio: 18 % — ABNORMAL LOW (ref 24–39)
T4, Total: 7.4 ug/dL (ref 4.5–12.0)
TSH: 1.5 u[IU]/mL (ref 0.450–4.500)

## 2019-07-29 LAB — CBC
HCT: 24.5 % — ABNORMAL LOW (ref 36.0–46.0)
Hemoglobin: 6.4 g/dL — CL (ref 12.0–15.0)
MCH: 17.3 pg — ABNORMAL LOW (ref 26.0–34.0)
MCHC: 26.1 g/dL — ABNORMAL LOW (ref 30.0–36.0)
MCV: 66.2 fL — ABNORMAL LOW (ref 80.0–100.0)
Platelets: 419 10*3/uL — ABNORMAL HIGH (ref 150–400)
RBC: 3.7 MIL/uL — ABNORMAL LOW (ref 3.87–5.11)
RDW: 17.7 % — ABNORMAL HIGH (ref 11.5–15.5)
WBC: 6.7 10*3/uL (ref 4.0–10.5)
nRBC: 0 % (ref 0.0–0.2)

## 2019-07-29 LAB — BASIC METABOLIC PANEL
BUN/Creatinine Ratio: 20 (ref 9–23)
BUN: 9 mg/dL (ref 6–20)
CO2: 22 mmol/L (ref 20–29)
Calcium: 8.7 mg/dL (ref 8.7–10.2)
Chloride: 107 mmol/L — ABNORMAL HIGH (ref 96–106)
Creatinine, Ser: 0.45 mg/dL — ABNORMAL LOW (ref 0.57–1.00)
GFR calc Af Amer: 146 mL/min/{1.73_m2} (ref >59)
GFR calc non Af Amer: 127 mL/min/{1.73_m2} (ref >59)
Glucose: 94 mg/dL (ref 65–99)
Potassium: 3.6 mmol/L (ref 3.5–5.2)
Sodium: 142 mmol/L (ref 134–144)

## 2019-07-29 LAB — FRUCTOSAMINE: Fructosamine: 196 umol/L (ref 0–285)

## 2019-07-29 LAB — IRON,TIBC AND FERRITIN PANEL
Ferritin: 4 ng/mL — ABNORMAL LOW (ref 15–150)
Iron Saturation: 3 % — CL (ref 15–55)
Iron: 12 ug/dL — ABNORMAL LOW (ref 27–159)
Total Iron Binding Capacity: 368 ug/dL (ref 250–450)
UIBC: 356 ug/dL (ref 131–425)

## 2019-07-29 LAB — CBC WITH DIFFERENTIAL/PLATELET
Basophils Absolute: 0 10*3/uL (ref 0.0–0.2)
Basos: 1 %
EOS (ABSOLUTE): 0.1 10*3/uL (ref 0.0–0.4)
Eos: 2 %
Hematocrit: 23.7 % — ABNORMAL LOW (ref 34.0–46.6)
Hemoglobin: 6.5 g/dL — CL (ref 11.1–15.9)
Immature Grans (Abs): 0 10*3/uL (ref 0.0–0.1)
Immature Granulocytes: 1 %
Lymphocytes Absolute: 2.1 10*3/uL (ref 0.7–3.1)
Lymphs: 36 %
MCH: 17.4 pg — ABNORMAL LOW (ref 26.6–33.0)
MCHC: 27.4 g/dL — ABNORMAL LOW (ref 31.5–35.7)
MCV: 64 fL — ABNORMAL LOW (ref 79–97)
Monocytes Absolute: 0.5 10*3/uL (ref 0.1–0.9)
Monocytes: 8 %
Neutrophils Absolute: 3.1 10*3/uL (ref 1.4–7.0)
Neutrophils: 52 %
Platelets: 411 10*3/uL (ref 150–450)
RBC: 3.73 x10E6/uL — ABNORMAL LOW (ref 3.77–5.28)
RDW: 17 % — ABNORMAL HIGH (ref 11.7–15.4)
WBC: 5.8 10*3/uL (ref 3.4–10.8)

## 2019-07-29 LAB — I-STAT BETA HCG BLOOD, ED (MC, WL, AP ONLY): I-stat hCG, quantitative: <5 m[IU]/mL (ref <5)

## 2019-07-29 LAB — ABO/RH: ABO/RH(D): B POS

## 2019-07-29 NOTE — ED Triage Notes (Signed)
Hgb 6.5 at PCP

## 2019-07-29 NOTE — Telephone Encounter (Signed)
Medical Assistant used [employer] to contact patient.  Interpreter Name: Jacquenette Shone Interpreter #:  Patient verified DOB Patient is aware of hemoglobin and iron being critically low and needing to report to the ED immediately. Patient admits to heavy and lengthy cycles. Patient is also aware of Diabetes not being confirmed. Patient is aware of throwing the expired meter away. Patient shared she is renewing her license and will report immediately to the ED afterwards.

## 2019-07-29 NOTE — ED Triage Notes (Signed)
Pt in POV, sent by PCP for low hemoglobin. Reports heavy vaginal bleeding during menstrual cycle.

## 2019-07-29 NOTE — Telephone Encounter (Signed)
-----   Message from Bing Neighbors, FNP sent at 07/29/2019  2:10 PM EDT ----- Please call patient using Spanish interpreter to advise her hemoglobin and iron are both critically low and she requires further evaluation in the emergency room today. Follow-up evaluation should be not be delayed. The source of blood loss needs to be evaluated. She doesn't have diabetes and therefore do not start metformin. Both A1C and Fruticose level confirm no diabetes. Abnormal reading likely due to improper functioning of meter.

## 2019-07-30 LAB — PREPARE RBC (CROSSMATCH)

## 2019-07-30 MED ORDER — FERROUS SULFATE 325 (65 FE) MG PO TABS: 325.0000 mg | ORAL_TABLET | Freq: Every day | ORAL | 0 refills | Status: AC

## 2019-07-30 MED ORDER — SODIUM CHLORIDE 0.9 % IV SOLN
10.0000 mL/h | Freq: Once | INTRAVENOUS | Status: AC
  Administered 2019-07-30: 10 mL/h via INTRAVENOUS

## 2019-07-30 NOTE — ED Notes (Signed)
Blood consent signed in chart.

## 2019-07-30 NOTE — ED Provider Notes (Signed)
[facility] EMERGENCY DEPARTMENT Provider Note   CSN: [id number] Arrival date & time: 07/29/19  2101     History Chief Complaint  Patient presents with  . Abnormal Lab    Tiffany Lamb is a 39 y.o. female.  HPI     This is a 39 year old female with a history of anemia and diabetes who presents with abnormal lab values.  She presented yesterday to a new primary care office and had lab work done.  She was originally evaluated for hyperglycemia.  However during routine blood work was noted to have low iron and a hemoglobin of 6.5.  Patient reports that she has very heavy periods.  Her last period was on 3/21.  She states over the last 3 weeks she has had dizziness, headache, seeing spots, and some shortness of breath.  She denies any bleeding with stools.  She denies any recent fevers, cough, chest pain, abdominal pain, nausea, vomiting.  Labs reviewed.  Hemoglobin 6.5.  Iron and ferritin levels low.  Differential consistent with a microcytic anemia.  Past Medical History:  Diagnosis Date  . Anemia   . Depression   . Diabetes mellitus without complication [facility])     Patient Active Problem List   Diagnosis Date Noted  . Morbid obesity ([place]) 10/07/2018  . Gestational diabetes 05/27/2017  . Advanced maternal age in multigravida 02/06/2017  . [place] multiparity 02/06/2017  . Supervision of high risk pregnancy, antepartum 01/09/2017  . Depression affecting pregnancy in second trimester, antepartum 11/26/2016  . Maternal pregestational diabetes classes B through R, antepartum 11/26/2016    Past Surgical History:  Procedure Laterality Date  . gall bladder  2007     OB History    Gravida  7   Para  7   Term  7   Preterm      AB      Living  7     SAB      TAB      Ectopic      Multiple  0   Live Births  7           Family History  Problem Relation Age of Onset  . Hepatitis Mother   . Heart disease Brother   . Cancer Paternal  Aunt        breast  . Cancer Paternal Grandmother        liver  . Cancer Paternal Grandfather        gastric  . Diabetes Paternal Grandfather     Social History   Tobacco Use  . Smoking status: Never Smoker  . Smokeless tobacco: Never Used  Substance Use Topics  . Alcohol use: No  . Drug use: No    Home Medications Prior to Admission medications   Medication Sig Start Date End Date Taking? Authorizing Provider  Blood Glucose Monitoring Suppl (TRUE METRIX METER) w/Device KIT Dispense to check blood sugar twice daily, AM and PM E11.9 07/28/19   Scot Jun, FNP  famotidine (PEPCID) 20 MG tablet Take 1 tablet (20 mg total) by mouth 2 (two) times daily as needed for heartburn or indigestion. 07/28/19   Scot Jun, FNP  ferrous sulfate 325 (65 FE) MG tablet Take 1 tablet (325 mg total) by mouth daily. 07/30/19   Horton, Barbette Hair, MD  glucose blood (RELION TRUE METRIX TEST STRIPS) test strip Check blood sugar twice daily  E.11.9 07/28/19   Scot Jun, FNP  Lancets Thin MISC Check  blood sugar E.11.9 07/28/19   Scot Jun, FNP  metFORMIN (GLUCOPHAGE) 500 MG tablet Take 1 tablet (500 mg total) by mouth 2 (two) times daily with a meal. Patient not taking: Reported on 07/28/2019 07/24/19   Elsie Stain, MD  sertraline (ZOLOFT) 25 MG tablet Take 1 tablet (25 mg total) by mouth daily. 07/28/19   Scot Jun, FNP  cetirizine (ZYRTEC) 10 MG tablet Take 1 tablet (10 mg total) by mouth daily. Patient not taking: Reported on 03/21/2019 10/07/18 03/21/19  Gildardo Pounds, NP  fluticasone [facility]) 50 MCG/ACT nasal spray Place 2 sprays into both nostrils daily. Patient not taking: Reported on 03/21/2019 10/07/18 03/21/19  Gildardo Pounds, NP  omeprazole (PRILOSEC) 20 MG capsule Take 1 capsule (20 mg total) by mouth daily before breakfast for 30 days. Patient not taking: Reported on 03/21/2019 10/17/18 03/21/19  Gildardo Pounds, NP    Allergies    Latex  Review  of Systems   Review of Systems  Constitutional: Negative for fever.  Eyes: Positive for visual disturbance.  Respiratory: Positive for shortness of breath.   Cardiovascular: Negative for chest pain.  Gastrointestinal: Negative for abdominal pain.  Genitourinary: Negative for dysuria.  Neurological: Positive for dizziness, light-headedness and headaches.  All other systems reviewed and are negative.   Physical Exam Updated Vital Signs BP 101/61 (BP Location: Right Arm)   Pulse 75   Temp 98 F (36.7 C) (Oral)   Resp 17   Ht 1.6 m (5' 3")   Wt 102.1 kg   SpO2 97%   BMI 39.86 kg/m   Physical Exam Vitals and nursing note reviewed.  Constitutional:      Appearance: She is well-developed. She is obese. She is not ill-appearing.  HENT:     Head: Normocephalic and atraumatic.     Mouth/Throat:     Mouth: Mucous membranes are moist.  Eyes:     Pupils: Pupils are equal, round, and reactive to light.     Comments: Pale conjunctiva  Cardiovascular:     Rate and Rhythm: Normal rate and regular rhythm.     Heart sounds: Normal heart sounds.  Pulmonary:     Effort: Pulmonary effort is normal. No respiratory distress.     Breath sounds: No wheezing.  Abdominal:     General: Bowel sounds are normal.     Palpations: Abdomen is soft.     Tenderness: There is no abdominal tenderness.  Musculoskeletal:     Cervical back: Neck supple.  Skin:    General: Skin is warm and dry.  Neurological:     Mental Status: She is alert and oriented to person, place, and time.  Psychiatric:        Mood and Affect: Mood normal.     ED Results / Procedures / Treatments   Labs (all labs ordered are listed, but only abnormal results are displayed) Labs Reviewed  CBC - Abnormal; Notable for the following components:      Result Value   RBC 3.70 (*)    Hemoglobin 6.4 (*)    HCT 24.5 (*)    MCV 66.2 (*)    MCH 17.3 (*)    MCHC 26.1 (*)    RDW 17.7 (*)    Platelets 419 (*)    All other  components within normal limits  I-STAT BETA HCG BLOOD, ED ([facility], [facility], [facility] ONLY)  TYPE AND SCREEN  ABO/RH  PREPARE RBC (CROSSMATCH)    EKG None  Radiology  No results found.  Procedures Procedures (including critical care time)  CRITICAL CARE Performed by: Merryl Hacker   Total critical care time: 35 minutes  Critical care time was exclusive of separately billable procedures and treating other patients.  Critical care was necessary to treat or prevent imminent or life-threatening deterioration.  Critical care was time spent personally by me on the following activities: development of treatment plan with patient and/or surrogate as well as nursing, discussions with consultants, evaluation of patient's response to treatment, examination of patient, obtaining history from patient or surrogate, ordering and performing treatments and interventions, ordering and review of laboratory studies, ordering and review of radiographic studies, pulse oximetry and re-evaluation of patient's condition.  Medications Ordered in ED Medications  0.9 %  sodium chloride infusion (0 mL/hr Intravenous Stopped 07/30/19 0622)    ED Course  I have reviewed the triage vital signs and the nursing notes.  Pertinent labs & imaging results that were available during my care of the patient were reviewed by me and considered in my medical decision making (see chart for details).    MDM Rules/Calculators/A&P                       Patient presents with headache, vision changes, some dizziness. Noted to have anemia at an office visit. She is hemodynamically stable without tachycardia. She has had some borderline blood pressures but is stable. However, given her symptoms, feel they are likely related to anemia. For this reason, will transfuse for hemoglobin less than 7. Patient was consented for transfusion. She is not having any active or ongoing bleeding. She is not pregnant. Patient tolerated transfusion well.  She remains hemodynamically stable repeat vital signs are reassuring. Will start on daily iron. We will have her follow-up closely with her primary physician for recheck of hemoglobin.  After history, exam, and medical workup I feel the patient has been appropriately medically screened and is safe for discharge home. Pertinent diagnoses were discussed with the patient. Patient was given return precautions.   Final Clinical Impression(s) / ED Diagnoses Final diagnoses:  Symptomatic anemia  Low iron    Rx / DC Orders ED Discharge Orders         Ordered    ferrous sulfate 325 (65 FE) MG tablet  Daily     07/30/19 0713           Merryl Hacker, MD 07/30/19 [id number]

## 2019-07-30 NOTE — Discharge Instructions (Addendum)
You were seen today for symptomatic anemia.  You were given a unit of blood.  You need to follow-up with your primary doctor for repeat blood work.  Take iron daily.

## 2019-07-31 LAB — BPAM RBC
Blood Product Expiration Date: 202104152359
ISSUE DATE / TIME: 202103250351
Unit Type and Rh: 7300

## 2019-07-31 LAB — TYPE AND SCREEN
ABO/RH(D): B POS
Antibody Screen: NEGATIVE
Unit division: 0

## 2019-08-03 ENCOUNTER — Ambulatory Visit: Payer: Self-pay

## 2019-08-31 ENCOUNTER — Ambulatory Visit: Payer: Self-pay | Admitting: Nurse Practitioner

## 2022-09-11 NOTE — Congregational Nurse Program (Signed)
  Dept: [phone]   Congregational Nurse Program Note  Date of Encounter: 09/11/2022  Past Medical History: Past Medical History:  Diagnosis Date   Anemia    Depression    Diabetes mellitus without complication [facility])     Encounter Details:  CNP Questionnaire - 09/11/22 1623       Questionnaire   Ask client: Do you give verbal consent for me to treat you today? Yes    Student Assistance N/A    Location Patient Presenter, [place]    Visit Setting with Client Organization    Patient Status Migrant    Insurance Uninsured ([employer]/Self-Pay/Medicaid Family Planning)    Insurance/Financial Assistance Referral N/A    Medication Have Medication Insecurities    Medical Provider No    Screening Referrals Made Annual Wellness Visit    Medical Referrals Made [facility]    Medical Appointment Made N/A    Recently w/o PCP, now 1st time PCP visit completed due to CNs referral or appointment made N/A    Food N/A    Transportation N/A    Housing/Utilities N/A    Interpersonal Safety N/A    Interventions Navigate Healthcare System    Abnormal to Normal Screening Since Last CN Visit N/A    Screenings CN Performed Blood Pressure    Sent Client to Lab for: N/A    Did client attend any of the following based off CNs referral or appointments made? N/A    ED Visit Averted N/A    Life-Saving Intervention Made N/A            Client here for blood pressure screening, states she has a hx of low hgb and was instructed to take iron pills for her anemia. Client given face masks and over the counter covid test.
